# Patient Record
Sex: Female | Born: 1980 | Hispanic: Yes | Marital: Single | Smoking: Never smoker
Health system: Southern US, Community
[De-identification: ages and names within clinical notes are randomized; demographics above are authoritative.]

## PROBLEM LIST (undated history)

## (undated) ENCOUNTER — Inpatient Hospital Stay (HOSPITAL_COMMUNITY): Payer: Self-pay

## (undated) DIAGNOSIS — F329 Major depressive disorder, single episode, unspecified: Secondary | ICD-10-CM

## (undated) DIAGNOSIS — F32A Depression, unspecified: Secondary | ICD-10-CM

## (undated) DIAGNOSIS — E785 Hyperlipidemia, unspecified: Secondary | ICD-10-CM

## (undated) DIAGNOSIS — N390 Urinary tract infection, site not specified: Secondary | ICD-10-CM

## (undated) DIAGNOSIS — J45909 Unspecified asthma, uncomplicated: Secondary | ICD-10-CM

## (undated) DIAGNOSIS — D649 Anemia, unspecified: Secondary | ICD-10-CM

## (undated) HISTORY — DX: Depression, unspecified: F32.A

## (undated) HISTORY — DX: Major depressive disorder, single episode, unspecified: F32.9

## (undated) HISTORY — DX: Urinary tract infection, site not specified: N39.0

## (undated) HISTORY — DX: Anemia, unspecified: D64.9

## (undated) HISTORY — DX: Hyperlipidemia, unspecified: E78.5

## (undated) HISTORY — DX: Unspecified asthma, uncomplicated: J45.909

---

## 2004-07-05 ENCOUNTER — Emergency Department (HOSPITAL_COMMUNITY): Admission: EM | Admit: 2004-07-05 | Discharge: 2004-07-05 | Payer: Self-pay | Admitting: *Deleted

## 2004-07-22 ENCOUNTER — Emergency Department (HOSPITAL_COMMUNITY): Admission: EM | Admit: 2004-07-22 | Discharge: 2004-07-22 | Payer: Self-pay | Admitting: Family Medicine

## 2008-06-06 ENCOUNTER — Emergency Department (HOSPITAL_COMMUNITY): Admission: EM | Admit: 2008-06-06 | Discharge: 2008-06-06 | Payer: Self-pay | Admitting: Emergency Medicine

## 2012-06-17 ENCOUNTER — Encounter: Payer: Self-pay | Admitting: Obstetrics and Gynecology

## 2012-09-09 ENCOUNTER — Other Ambulatory Visit (HOSPITAL_COMMUNITY): Payer: Self-pay | Admitting: Physician Assistant

## 2012-09-09 DIAGNOSIS — Z3689 Encounter for other specified antenatal screening: Secondary | ICD-10-CM

## 2012-09-09 LAB — OB RESULTS CONSOLE HEPATITIS B SURFACE ANTIGEN: Hepatitis B Surface Ag: NEGATIVE

## 2012-09-09 LAB — OB RESULTS CONSOLE ABO/RH: RH Type: POSITIVE

## 2012-09-09 LAB — OB RESULTS CONSOLE ANTIBODY SCREEN: Antibody Screen: NEGATIVE

## 2012-09-09 LAB — OB RESULTS CONSOLE GC/CHLAMYDIA: Gonorrhea: NEGATIVE

## 2012-09-09 LAB — OB RESULTS CONSOLE RPR: RPR: NONREACTIVE

## 2012-10-28 ENCOUNTER — Encounter (HOSPITAL_COMMUNITY): Payer: Self-pay

## 2012-10-28 ENCOUNTER — Ambulatory Visit (HOSPITAL_COMMUNITY)
Admission: RE | Admit: 2012-10-28 | Discharge: 2012-10-28 | Disposition: A | Discharge status: Home / Self Care | Payer: Self-pay | Source: Ambulatory Visit | Attending: Physician Assistant | Admitting: Physician Assistant

## 2012-10-28 DIAGNOSIS — Z1389 Encounter for screening for other disorder: Secondary | ICD-10-CM | POA: Insufficient documentation

## 2012-10-28 DIAGNOSIS — Z3689 Encounter for other specified antenatal screening: Secondary | ICD-10-CM

## 2012-10-28 DIAGNOSIS — Z363 Encounter for antenatal screening for malformations: Secondary | ICD-10-CM | POA: Insufficient documentation

## 2012-10-28 DIAGNOSIS — O358XX0 Maternal care for other (suspected) fetal abnormality and damage, not applicable or unspecified: Secondary | ICD-10-CM | POA: Insufficient documentation

## 2013-03-03 ENCOUNTER — Other Ambulatory Visit: Payer: Self-pay | Admitting: Nurse Practitioner

## 2013-03-03 DIAGNOSIS — N644 Mastodynia: Secondary | ICD-10-CM

## 2013-03-03 DIAGNOSIS — N63 Unspecified lump in unspecified breast: Secondary | ICD-10-CM

## 2013-03-03 LAB — OB RESULTS CONSOLE GBS: GBS: NEGATIVE

## 2013-03-08 ENCOUNTER — Ambulatory Visit
Admission: RE | Admit: 2013-03-08 | Discharge: 2013-03-08 | Disposition: A | Discharge status: Home / Self Care | Payer: No Typology Code available for payment source | Source: Ambulatory Visit | Attending: Nurse Practitioner | Admitting: Nurse Practitioner

## 2013-03-08 DIAGNOSIS — N63 Unspecified lump in unspecified breast: Secondary | ICD-10-CM

## 2013-03-08 DIAGNOSIS — N644 Mastodynia: Secondary | ICD-10-CM

## 2013-03-30 ENCOUNTER — Encounter (HOSPITAL_COMMUNITY): Payer: Self-pay | Admitting: *Deleted

## 2013-03-30 ENCOUNTER — Other Ambulatory Visit (HOSPITAL_COMMUNITY): Payer: Self-pay | Admitting: Family

## 2013-03-30 ENCOUNTER — Telehealth (HOSPITAL_COMMUNITY): Payer: Self-pay | Admitting: *Deleted

## 2013-03-30 DIAGNOSIS — O480 Post-term pregnancy: Secondary | ICD-10-CM

## 2013-03-30 NOTE — Telephone Encounter (Signed)
Preadmission screen [phone] interpreter number pt states she is feeling poorly with difficulty sleeping and pain at her waist.  Told pt she can come to [facility] for evaluation.

## 2013-04-01 ENCOUNTER — Ambulatory Visit (HOSPITAL_COMMUNITY): Admission: RE | Admit: 2013-04-01 | Payer: Self-pay | Source: Ambulatory Visit

## 2013-04-01 ENCOUNTER — Ambulatory Visit (HOSPITAL_COMMUNITY)
Admission: RE | Admit: 2013-04-01 | Discharge: 2013-04-01 | Disposition: A | Discharge status: Home / Self Care | Payer: Self-pay | Source: Ambulatory Visit | Attending: Family | Admitting: Family

## 2013-04-01 DIAGNOSIS — Z3689 Encounter for other specified antenatal screening: Secondary | ICD-10-CM | POA: Insufficient documentation

## 2013-04-01 DIAGNOSIS — O480 Post-term pregnancy: Secondary | ICD-10-CM | POA: Insufficient documentation

## 2013-04-05 ENCOUNTER — Inpatient Hospital Stay (HOSPITAL_COMMUNITY)
Admission: RE | Admit: 2013-04-05 | Discharge: 2013-04-11 | DRG: 765 | Disposition: A | Discharge status: Home / Self Care | Payer: Medicaid Other | Source: Ambulatory Visit | Attending: Family Medicine | Admitting: Family Medicine

## 2013-04-05 ENCOUNTER — Encounter (HOSPITAL_COMMUNITY): Payer: Self-pay

## 2013-04-05 DIAGNOSIS — O411090 Infection of amniotic sac and membranes, unspecified, unspecified trimester, not applicable or unspecified: Secondary | ICD-10-CM | POA: Diagnosis present

## 2013-04-05 DIAGNOSIS — O480 Post-term pregnancy: Principal | ICD-10-CM | POA: Diagnosis present

## 2013-04-05 LAB — CBC
Hemoglobin: 11.3 g/dL — ABNORMAL LOW (ref 12.0–15.0)
MCH: 28.6 pg (ref 26.0–34.0)
MCHC: 33.1 g/dL (ref 30.0–36.0)
MCV: 86.3 fL (ref 78.0–100.0)
Platelets: 195 10*3/uL (ref 150–400)
RBC: 3.95 MIL/uL (ref 3.87–5.11)

## 2013-04-05 MED ORDER — ACETAMINOPHEN 325 MG PO TABS: 650.0000 mg | ORAL_TABLET | ORAL | Status: DC | PRN

## 2013-04-05 MED ORDER — OXYTOCIN 40 UNITS IN LACTATED RINGERS INFUSION - SIMPLE MED: 62.5000 mL/h | INTRAVENOUS | Status: DC

## 2013-04-05 MED ORDER — CITRIC ACID-SODIUM CITRATE 334-500 MG/5ML PO SOLN
30.0000 mL | ORAL | Status: DC | PRN
  Administered 2013-04-08: 30 mL via ORAL
  Filled 2013-04-05: qty 15

## 2013-04-05 MED ORDER — OXYTOCIN BOLUS FROM INFUSION: 500.0000 mL | INTRAVENOUS | Status: DC

## 2013-04-05 MED ORDER — LACTATED RINGERS IV SOLN
INTRAVENOUS | Status: DC
  Administered 2013-04-05 – 2013-04-08 (×8): via INTRAVENOUS

## 2013-04-05 MED ORDER — IBUPROFEN 600 MG PO TABS: 600.0000 mg | ORAL_TABLET | Freq: Four times a day (QID) | ORAL | Status: DC | PRN

## 2013-04-05 MED ORDER — ONDANSETRON HCL 4 MG/2ML IJ SOLN
4.0000 mg | Freq: Four times a day (QID) | INTRAMUSCULAR | Status: DC | PRN
  Administered 2013-04-07: 4 mg via INTRAVENOUS
  Filled 2013-04-05: qty 2

## 2013-04-05 MED ORDER — TERBUTALINE SULFATE 1 MG/ML IJ SOLN: 0.2500 mg | Freq: Once | INTRAMUSCULAR | Status: AC | PRN

## 2013-04-05 MED ORDER — LIDOCAINE HCL (PF) 1 % IJ SOLN
30.0000 mL | INTRAMUSCULAR | Status: DC | PRN
  Filled 2013-04-05: qty 30

## 2013-04-05 MED ORDER — OXYCODONE-ACETAMINOPHEN 5-325 MG PO TABS: 1.0000 | ORAL_TABLET | ORAL | Status: DC | PRN

## 2013-04-05 MED ORDER — LACTATED RINGERS IV SOLN: 500.0000 mL | INTRAVENOUS | Status: DC | PRN

## 2013-04-05 MED ORDER — MISOPROSTOL 25 MCG QUARTER TABLET
25.0000 ug | ORAL_TABLET | ORAL | Status: DC | PRN
  Administered 2013-04-05 – 2013-04-06 (×5): 25 ug via VAGINAL
  Filled 2013-04-05 (×5): qty 0.25

## 2013-04-05 NOTE — H&P (Signed)
Emily Andrade is a 32 y.o. female G1P0 at [redacted]w[redacted]d (based on LMP and 18w U/S) presenting for IOL for post-dates.   She has no complaints at this time other than some lower abdominal cramping with intermittent contractions. She denies HA, vision changes, RUQ/epigastric pain, swelling. No VB, LOF, regular contractions. Feeling good fetal movement.   [facility] has provided [facility]. This is her first pregnancy and is notable for equivocal rubella immune status. She also has a history of depression.   Interview was facilitated with Spanish interpreter.  History OB History   Grav Para Term Preterm Abortions TAB SAB Ect Mult Living   1              Past Medical History  Diagnosis Date  . Frequent UTI   . Depression   . Anemia   . Asthma     childhood   Past Surgical History  Procedure Laterality Date  . No past surgeries     Family History: family history includes Birth defects in her other; Diabetes in her father; Heart disease in her father. Social History:  reports that she has never smoked. She has never used smokeless tobacco. She reports that she does not drink alcohol or use illicit drugs.   Prenatal Transfer Tool  Maternal Diabetes: No Genetic Screening: Normal Maternal Ultrasounds/Referrals: Normal Fetal Ultrasounds or other Referrals:  None Maternal Substance Abuse:  No Significant Maternal Medications:  None Significant Maternal Lab Results:  Lab values include: Group B Strep negative, Other: Rubella equivocal immune status.  Other Comments:  None  ROS As per HPI, otherwise ROS negative.    Blood pressure 120/88, pulse 66, temperature 98.5 F (36.9 C), temperature source Oral, resp. rate 18, height 5\' 1"  (1.549 m), weight 72.576 kg (160 lb), last menstrual period 06/18/2012. Maternal Exam:  Introitus: Vulva is negative for lesion.  Vagina is negative for discharge.    Physical Exam  Constitutional: She is oriented to person, place, and time. She appears  well-developed and well-nourished.  HENT:  Head: Normocephalic.  Mouth/Throat: Oropharynx is clear and moist.  Eyes: EOM are normal. Pupils are equal, round, and reactive to light.  Neck: Normal range of motion. Neck supple.  Cardiovascular: Normal rate, regular rhythm and normal heart sounds.   Respiratory: Effort normal and breath sounds normal. No respiratory distress.  GI: Soft. There is no tenderness.  Genitourinary: Vulva exhibits no lesion. No vaginal discharge found.  Musculoskeletal: Normal range of motion.  Neurological: She is alert and oriented to person, place, and time.  Skin: Skin is warm.    Cervical exam: FT/thick/mid/-2  FHT: Baseline 130, mod variability, 15x15 accels present, no decels Toco: Irregular contractions  Prenatal labs: ABO, Rh: O/Positive/-- (05/08 0000) Antibody: Negative (05/08 0000) Rubella: Equivocal (05/08 0000) RPR: Nonreactive (05/08 0000)  HBsAg: Negative (05/08 0000)  HIV: Non-reactive (05/08 0000)  GBS: Negative (10/30 0000)   Assessment/Plan: Emily Andrade is a 32 y.o. G1P0 at [redacted]w[redacted]d here for IOL for postdates.   #Labor: Cytotec for cervical ripening (Bishop score 2) #Pain:  IV fentanyl, epidural upon request.  #FWB: Category I  #ID:  GBS neg #MOF: Breast feeding #MOC: Undecided #Circ:  Declines  Anticipate NSVD  Will need MMR post partum  [name] 04/05/2013, 8:52 PM  I was present for the exam and agree with above.  [place], CNM 04/06/2013 2:27 AM

## 2013-04-06 LAB — RPR: RPR Ser Ql: NONREACTIVE

## 2013-04-06 LAB — ABO/RH: ABO/RH(D): O POS

## 2013-04-06 MED ORDER — BUTORPHANOL TARTRATE 1 MG/ML IJ SOLN
2.0000 mg | INTRAMUSCULAR | Status: DC | PRN
  Administered 2013-04-07 (×2): 2 mg via INTRAVENOUS
  Filled 2013-04-06 (×3): qty 2

## 2013-04-06 MED ORDER — OXYTOCIN 40 UNITS IN LACTATED RINGERS INFUSION - SIMPLE MED
1.0000 m[IU]/min | INTRAVENOUS | Status: DC
  Administered 2013-04-06: 6 m[IU]/min via INTRAVENOUS

## 2013-04-06 MED ORDER — OXYTOCIN 40 UNITS IN LACTATED RINGERS INFUSION - SIMPLE MED
1.0000 m[IU]/min | INTRAVENOUS | Status: DC
  Administered 2013-04-06: 2 m[IU]/min via INTRAVENOUS
  Filled 2013-04-06: qty 1000

## 2013-04-06 MED ORDER — TERBUTALINE SULFATE 1 MG/ML IJ SOLN: 0.2500 mg | Freq: Once | INTRAMUSCULAR | Status: AC | PRN

## 2013-04-06 NOTE — Progress Notes (Signed)
Emily Andrade is a 32 y.o. G1P0 at [redacted]w[redacted]d.  Subjective: Cramping   Objective: BP 109/62  Pulse 53  Temp(Src) 98.2 F (36.8 C) (Oral)  Resp 18  Ht 5\' 1"  (1.549 m)  Wt 72.576 kg (160 lb)  BMI 30.25 kg/m2  LMP 06/18/2012      FHT:  FHR: 140 bpm, variability: min-moderate,  accelerations:  Present,  decelerations:  Absent UC:   regular, every 2-4 minutes, mild SVE:   Dilation: Fingertip Effacement (%): 40 Station: -2 Exam by:: Emily Andrade cnm  Labs: Lab Results  Component Value Date   WBC 6.6 04/05/2013   HGB 11.3* 04/05/2013   HCT 34.1* 04/05/2013   MCV 86.3 04/05/2013   PLT 195 04/05/2013    Assessment / Plan: Induction of labor due to postterm,  progressing appropriately  Labor: Progressing normally Preeclampsia:  NA Fetal Wellbeing:  Category I Pain Control:  Labor support without medications I/D:  n/a Anticipated MOD:  NSVD  Emily Andrade States 04/06/2013, 6:44 AM

## 2013-04-06 NOTE — Progress Notes (Signed)
Attestation of Attending Supervision of Advanced Practitioner: Evaluation and management procedures were performed by the PA/NP/CNM/OB Fellow under my supervision/collaboration. Chart reviewed and agree with management and plan.  I was able to assist with evaluation, and  Place foley in cervix with 60 cc saline instilled. Tilda Burrow 04/06/2013 9:39 PM

## 2013-04-06 NOTE — Progress Notes (Signed)
Emily Andrade is a 32 y.o. G1P0 at [redacted]w[redacted]d admitted for IOL for post dates.   Subjective: Pt reporting lower abdominal cramping with contractions, not severe enough for pain medication. Otherwise doing well.   Objective: BP 108/71  Pulse 53  Temp(Src) 98.5 F (36.9 C) (Oral)  Resp 18  Ht 5\' 1"  (1.549 m)  Wt 72.576 kg (160 lb)  BMI 30.25 kg/m2  LMP 06/18/2012      FHT:  FHR: 140 bpm, variability: moderate,  accelerations:  Present,  decelerations:  Absent UC:   irregular, every 2-5 minutes SVE:   Dilation: 1.5 Effacement (%): Thick Station: -2 Exam by:: [employer]: Lab Results  Component Value Date   WBC 6.6 04/05/2013   HGB 11.3* 04/05/2013   HCT 34.1* 04/05/2013   MCV 86.3 04/05/2013   PLT 195 04/05/2013    Assessment / Plan: IOL for post dates, progressing with foley bulb still in place.   Labor: FB in place, continuing on pit up to 6 mu/min.  Preeclampsia:  No signs or symptoms.  Fetal Wellbeing:  Category I Pain Control:  Labor support without medications I/D:  GBS neg Anticipated MOD:  NSVD  Emily Andrade 04/06/2013, 11:59 PM

## 2013-04-06 NOTE — Progress Notes (Signed)
Emily Andrade is a 32 y.o. G1P0 at [redacted]w[redacted]d admitted for induction of labor due to Post dates.  Subjective: Pt comfortable without issue. Minimal pressrue  Objective: BP 115/52  Pulse 59  Temp(Src) 97.9 F (36.6 C) (Oral)  Resp 18  Ht 5\' 1"  (1.549 m)  Wt 72.576 kg (160 lb)  BMI 30.25 kg/m2  LMP 06/18/2012      FHT:  FHR: 150 bpm, variability: minimal ,  accelerations:  Abscent,  decelerations:  Absent UC:   irregular, every 3-6 minutes SVE:   Dilation: Fingertip Effacement (%): 50 Station: -1 Exam by:: J.Cox, RN  Labs: Lab Results  Component Value Date   WBC 6.6 04/05/2013   HGB 11.3* 04/05/2013   HCT 34.1* 04/05/2013   MCV 86.3 04/05/2013   PLT 195 04/05/2013    Assessment / Plan: Induction of labor due to postterm,  progressing well on pitocin  Labor: No significant change, no w/ 4th dose of cytotec Preeclampsia:  no signs or symptoms of toxicity Fetal Wellbeing:  Category II Pain Control:  Labor support without medications at this time I/D:  n/a Anticipated MOD:  NSVD  Emily Andrade 04/06/2013, 12:03 PM

## 2013-04-06 NOTE — Progress Notes (Signed)
I was present for the exam and agree with above.  [place], CNM 04/06/2013 2:27 AM

## 2013-04-06 NOTE — Progress Notes (Signed)
Emily Andrade is a 32 y.o. G1P0 at [redacted]w[redacted]d admitted for induction of labor due to Post dates. Due date 03/25/13.  Subjective: Pt reports more pain with contractions but they are still infrequent and only minimally painful. She has no complaints. With translator present, foley bulb placement was discussed and she agreed to have it placed.  Objective: BP 113/58  Pulse 57  Temp(Src) 98.3 F (36.8 C) (Oral)  Resp 18  Ht 5\' 1"  (1.549 m)  Wt 72.576 kg (160 lb)  BMI 30.25 kg/m2  LMP 06/18/2012      FHT:  FHR: 145 bpm, variability: moderate,  accelerations:  Present,  decelerations:  Absent UC:   Irregular with no pattern SVE:   Dilation: 1.5 Effacement (%): Thick Station: -2 Exam by:: [employer]: Lab Results  Component Value Date   WBC 6.6 04/05/2013   HGB 11.3* 04/05/2013   HCT 34.1* 04/05/2013   MCV 86.3 04/05/2013   PLT 195 04/05/2013    Assessment / Plan: Induction of labor due to postterm, now making cervical change with cytotec.  Labor: Cervical change made with cytotec but not favorable for pitocin at this time. Foley bulb placed by Dr. Emelda Fear. Will start pit at 2000. Preeclampsia:  no signs/symptoms Fetal Wellbeing:  Category I Pain Control:  Labor support without medications I/D:  n/a GBS neg Anticipated MOD:  NSVD  Emily Andrade, Emily Andrade 04/06/2013, 6:27 PM

## 2013-04-06 NOTE — Progress Notes (Signed)
Emily Andrade is a 32 y.o. G1P0 at [redacted]w[redacted]d by LMP admitted for induction of labor due to post-dates.  Subjective: Pt feeling crampy discomfort in lower abdomen, declines pain medications at this time. + GFM  Objective: BP 95/69  Pulse 82  Temp(Src) 98.2 F (36.8 C) (Oral)  Resp 18  Ht 5\' 1"  (1.549 m)  Wt 72.576 kg (160 lb)  BMI 30.25 kg/m2  LMP 06/18/2012      FHT:  FHR: 130 bpm, variability: moderate,  accelerations:  Present,  decelerations:  Absent UC:   irregular, every 8-10 minutes SVE:   Dilation: Fingertip Effacement (%): Thick Station: -2 Exam by:: v. smith cnm  Labs: Lab Results  Component Value Date   WBC 6.6 04/05/2013   HGB 11.3* 04/05/2013   HCT 34.1* 04/05/2013   MCV 86.3 04/05/2013   PLT 195 04/05/2013    Assessment / Plan: Emily Andrade is a 32 y.o. G1P0 at [redacted]w[redacted]d for IOL. Continuing cytotec. To place foley bulb when able.    Labor: Continue cervical ripening and begin pitocin when cervix favorable. Preeclampsia:  n/a Fetal Wellbeing:  Category I Pain Control:  Fentanyl I/D:  n/a Anticipated MOD:  NSVD  Emily Andrade 04/06/2013, 2:05 AM

## 2013-04-07 ENCOUNTER — Inpatient Hospital Stay (HOSPITAL_COMMUNITY): Payer: Medicaid Other | Admitting: Anesthesiology

## 2013-04-07 ENCOUNTER — Encounter (HOSPITAL_COMMUNITY): Payer: Medicaid Other | Admitting: Anesthesiology

## 2013-04-07 ENCOUNTER — Encounter (HOSPITAL_COMMUNITY): Payer: Self-pay

## 2013-04-07 MED ORDER — PHENYLEPHRINE 40 MCG/ML (10ML) SYRINGE FOR IV PUSH (FOR BLOOD PRESSURE SUPPORT): 80.0000 ug | PREFILLED_SYRINGE | INTRAVENOUS | Status: DC | PRN

## 2013-04-07 MED ORDER — OXYTOCIN 40 UNITS IN LACTATED RINGERS INFUSION - SIMPLE MED
1.0000 m[IU]/min | INTRAVENOUS | Status: DC
  Administered 2013-04-07: 10 m[IU]/min via INTRAVENOUS
  Administered 2013-04-08 (×2): 40 m[IU]/min via INTRAVENOUS
  Filled 2013-04-07 (×2): qty 1000

## 2013-04-07 MED ORDER — EPHEDRINE 5 MG/ML INJ
10.0000 mg | INTRAVENOUS | Status: DC | PRN
  Filled 2013-04-07: qty 4

## 2013-04-07 MED ORDER — EPHEDRINE 5 MG/ML INJ: 10.0000 mg | INTRAVENOUS | Status: DC | PRN

## 2013-04-07 MED ORDER — FENTANYL 2.5 MCG/ML BUPIVACAINE 1/10 % EPIDURAL INFUSION (WH - ANES)
14.0000 mL/h | INTRAMUSCULAR | Status: DC | PRN
  Administered 2013-04-07 – 2013-04-08 (×3): 14 mL/h via EPIDURAL
  Filled 2013-04-07 (×4): qty 125

## 2013-04-07 MED ORDER — LIDOCAINE HCL (PF) 1 % IJ SOLN
INTRAMUSCULAR | Status: DC | PRN
  Administered 2013-04-07 (×2): 9 mL

## 2013-04-07 MED ORDER — DIPHENHYDRAMINE HCL 50 MG/ML IJ SOLN: 12.5000 mg | INTRAMUSCULAR | Status: DC | PRN

## 2013-04-07 MED ORDER — LACTATED RINGERS IV SOLN
500.0000 mL | Freq: Once | INTRAVENOUS | Status: AC
  Administered 2013-04-07: 1000 mL via INTRAVENOUS

## 2013-04-07 MED ORDER — PHENYLEPHRINE 40 MCG/ML (10ML) SYRINGE FOR IV PUSH (FOR BLOOD PRESSURE SUPPORT)
80.0000 ug | PREFILLED_SYRINGE | INTRAVENOUS | Status: DC | PRN
  Filled 2013-04-07: qty 10

## 2013-04-07 MED ORDER — FENTANYL 2.5 MCG/ML BUPIVACAINE 1/10 % EPIDURAL INFUSION (WH - ANES)
INTRAMUSCULAR | Status: DC | PRN
  Administered 2013-04-07: 14 mL/h via EPIDURAL

## 2013-04-07 NOTE — Anesthesia Procedure Notes (Signed)
Epidural Patient location during procedure: OB Start time: 04/07/2013 2:08 PM End time: 04/07/2013 2:12 PM  Staffing Anesthesiologist: Leilani Able Performed by: anesthesiologist   Preanesthetic Checklist Completed: patient identified, surgical consent, pre-op evaluation, timeout performed, IV checked, risks and benefits discussed and monitors and equipment checked  Epidural Patient position: sitting Prep: site prepped and draped and DuraPrep Patient monitoring: continuous pulse ox and blood pressure Approach: midline Injection technique: LOR air  Needle:  Needle type: Tuohy  Needle gauge: 17 G Needle length: 9 cm and 9 Needle insertion depth: 5 cm cm Catheter type: closed end flexible Catheter size: 19 Gauge Catheter at skin depth: 10 cm Test dose: negative and Other  Assessment Sensory level: T9 Events: blood not aspirated, injection not painful, no injection resistance, negative IV test and no paresthesia  Additional Notes Reason for block:procedure for pain

## 2013-04-07 NOTE — Progress Notes (Signed)
Emily Andrade is a 32 y.o. G1P0 at [redacted]w[redacted]d by LMP admitted for induction of labor due to Post dates. Due date 03/25/13.  Subjective: pt still tolerating mild-mod contractions with IV analgesics only   Objective: BP 98/58  Pulse 66  Temp(Src) 98 F (36.7 C) (Oral)  Resp 20  Ht 5\' 1"  (1.549 m)  Wt 72.576 kg (160 lb)  BMI 30.25 kg/m2  LMP 06/18/2012      FHT:  FHR: 145 bpm, variability: moderate,  accelerations:  Present,  decelerations:  Absent UC:   regular, every 3 minutes SVE:   Dilation: 4 Effacement (%): 20;30 Station: -2 Exam by:: dr. Emelda Fear  Labs: Lab Results  Component Value Date   WBC 6.6 04/05/2013   HGB 11.3* 04/05/2013   HCT 34.1* 04/05/2013   MCV 86.3 04/05/2013   PLT 195 04/05/2013    Assessment / Plan: Induction of labor due to postterm,  progressing well on pitocin  Labor: slow progress, AROM performed, will monitor for arrest of labor Preeclampsia:  3 Fetal Wellbeing:  Category I Pain Control:  Labor support without medications and iv stadol I/D:  n/a Anticipated MOD:  undetermined  Emily Andrade V 04/07/2013, 5:21 AM

## 2013-04-07 NOTE — Anesthesia Preprocedure Evaluation (Signed)
Anesthesia Evaluation  Patient identified by MRN, date of birth, ID band Patient awake    Reviewed: Allergy & Precautions, H&P , NPO status , Patient's Chart, lab work & pertinent test results  Airway Mallampati: II TM Distance: >3 FB Neck ROM: full    Dental no notable dental hx.    Pulmonary    Pulmonary exam normal       Cardiovascular negative cardio ROS      Neuro/Psych negative neurological ROS     GI/Hepatic negative GI ROS, Neg liver ROS,   Endo/Other  negative endocrine ROS  Renal/GU negative Renal ROS  negative genitourinary   Musculoskeletal negative musculoskeletal ROS (+)   Abdominal Normal abdominal exam  (+)   Peds  Hematology   Anesthesia Other Findings   Reproductive/Obstetrics (+) Pregnancy                           Anesthesia Physical Anesthesia Plan  ASA: II  Anesthesia Plan: Epidural   Post-op Pain Management:    Induction:   Airway Management Planned:   Additional Equipment:   Intra-op Plan:   Post-operative Plan:   Informed Consent: I have reviewed the patients History and Physical, chart, labs and discussed the procedure including the risks, benefits and alternatives for the proposed anesthesia with the patient or authorized representative who has indicated his/her understanding and acceptance.     Plan Discussed with:   Anesthesia Plan Comments:         Anesthesia Quick Evaluation

## 2013-04-07 NOTE — Progress Notes (Signed)
Emily Andrade is a 32 y.o. G1P0000 at [redacted]w[redacted]d by LMP admitted for induction of labor due to Post dates. Due date 03/25/13.  Subjective: Feeling more pain with contractions now and only getting little relief with IV pain meds.   Objective: BP 121/76  Pulse 62  Temp(Src) 98.3 F (36.8 C) (Oral)  Resp 20  Ht 5\' 1"  (1.549 m)  Wt 72.576 kg (160 lb)  BMI 30.25 kg/m2  LMP 06/18/2012      FHT:  FHR: 115 bpm, variability: moderate,  accelerations:  Present,  decelerations:  Absent UC:   regular, every 3-4 minutes SVE: Dilation: 5 Effacement (%): 80 Cervical Position: Anterior Station: -1 Presentation: Vertex Exam by:: Enis Slipper, RN Mod Mec  Labs: Lab Results  Component Value Date   WBC 6.6 04/05/2013   HGB 11.3* 04/05/2013   HCT 34.1* 04/05/2013   MCV 86.3 04/05/2013   PLT 195 04/05/2013    Assessment / Plan: Induction of labor due to postterm,  progressing well on pitocin  Labor: Progressing normally Fetal Wellbeing:  Category I Pain Control:  Fentanyl I/D:  GBS neg Anticipated MOD:  NSVD  Pior, Jearld Lesch 04/07/2013, 12:23 PM  I have seen and examined this patient and agree with above documentation in the resident's note. Pt now uncomfortable and requesting epidural.  Will get and then recheck  Rulon Abide, M.D. [facility] Fellow 04/07/2013 1:52 PM

## 2013-04-07 NOTE — H&P (Signed)
Attestation of Attending Supervision of Advanced Practitioner: Evaluation and management procedures were performed by the PA/NP/CNM/OB Fellow under my supervision/collaboration. Chart reviewed and agree with management and plan.  Xandra Laramee V 04/07/2013 5:43 AM

## 2013-04-07 NOTE — Progress Notes (Addendum)
Emily Andrade is a 32 y.o. G1P0 at [redacted]w[redacted]d  admitted for induction of labor due to postdates.  Subjective:  Pt doing well. Feeling contractions.  Taking iv fentanyl. +FM. Continued LOF.   Objective: BP 124/71  Pulse 53  Temp(Src) 97.9 F (36.6 C) (Oral)  Resp 20  Ht 5\' 1"  (1.549 m)  Wt 160 lb (72.576 kg)  BMI 30.25 kg/m2  LMP 06/18/2012      FHT:  FHR: 140 bpm, variability: moderate,  accelerations:  Present,  decelerations:  Absent UC:   Regular 4-5 min  SVE:   Dilation: 4.5 Effacement (%): 50 Station: -2 Exam by:: Dr. Reola Calkins  Labs: Lab Results  Component Value Date   WBC 6.6 04/05/2013   HGB 11.3* 04/05/2013   HCT 34.1* 04/05/2013   MCV 86.3 04/05/2013   PLT 195 04/05/2013    Assessment / Plan: Induction of labor due to postterm,  progressing well on pitocin  Labor: Progressing normally on 12 mu/min of pit. Cont to increase.  Fetal Wellbeing:  Category I Pain Control:  Fentanyl I/D:  n/a Anticipated MOD:  NSVD  Zooey Schreurs L 04/07/2013, 9:02 AM

## 2013-04-08 ENCOUNTER — Encounter (HOSPITAL_COMMUNITY)
Admission: RE | Disposition: A | Discharge status: Home / Self Care | Payer: Self-pay | Source: Ambulatory Visit | Attending: Family Medicine

## 2013-04-08 ENCOUNTER — Encounter (HOSPITAL_COMMUNITY): Payer: Self-pay

## 2013-04-08 LAB — PREPARE RBC (CROSSMATCH)

## 2013-04-08 SURGERY — Surgical Case
Anesthesia: Epidural | Site: Abdomen

## 2013-04-08 MED ORDER — OXYCODONE-ACETAMINOPHEN 5-325 MG PO TABS
1.0000 | ORAL_TABLET | ORAL | Status: DC | PRN
  Administered 2013-04-10: 2 via ORAL
  Administered 2013-04-10 – 2013-04-11 (×2): 1 via ORAL
  Filled 2013-04-08: qty 2
  Filled 2013-04-08 (×2): qty 1

## 2013-04-08 MED ORDER — DIPHENHYDRAMINE HCL 50 MG/ML IJ SOLN: 12.5000 mg | INTRAMUSCULAR | Status: DC | PRN

## 2013-04-08 MED ORDER — ONDANSETRON HCL 4 MG/2ML IJ SOLN: 4.0000 mg | Freq: Three times a day (TID) | INTRAMUSCULAR | Status: DC | PRN

## 2013-04-08 MED ORDER — DIPHENHYDRAMINE HCL 25 MG PO CAPS: 25.0000 mg | ORAL_CAPSULE | Freq: Four times a day (QID) | ORAL | Status: DC | PRN

## 2013-04-08 MED ORDER — OXYTOCIN 10 UNIT/ML IJ SOLN
40.0000 [IU] | INTRAVENOUS | Status: DC | PRN
  Administered 2013-04-08: 40 [IU] via INTRAVENOUS

## 2013-04-08 MED ORDER — CEFAZOLIN SODIUM-DEXTROSE 2-3 GM-% IV SOLR
INTRAVENOUS | Status: DC | PRN
  Administered 2013-04-08: 2 g via INTRAVENOUS

## 2013-04-08 MED ORDER — OXYTOCIN 10 UNIT/ML IJ SOLN
INTRAMUSCULAR | Status: AC
  Filled 2013-04-08: qty 4

## 2013-04-08 MED ORDER — ONDANSETRON HCL 4 MG/2ML IJ SOLN
INTRAMUSCULAR | Status: DC | PRN
  Administered 2013-04-08: 4 mg via INTRAVENOUS

## 2013-04-08 MED ORDER — SCOPOLAMINE 1 MG/3DAYS TD PT72
1.0000 | MEDICATED_PATCH | Freq: Once | TRANSDERMAL | Status: DC
  Administered 2013-04-08: 1.5 mg via TRANSDERMAL

## 2013-04-08 MED ORDER — DEXTROSE 5 % IV SOLN
120.0000 mg | Freq: Three times a day (TID) | INTRAVENOUS | Status: DC
  Administered 2013-04-08: 120 mg via INTRAVENOUS
  Filled 2013-04-08 (×2): qty 3

## 2013-04-08 MED ORDER — KETOROLAC TROMETHAMINE 30 MG/ML IJ SOLN
INTRAMUSCULAR | Status: AC
  Filled 2013-04-08: qty 1

## 2013-04-08 MED ORDER — KETOROLAC TROMETHAMINE 30 MG/ML IJ SOLN
30.0000 mg | Freq: Four times a day (QID) | INTRAMUSCULAR | Status: AC | PRN
  Administered 2013-04-08: 30 mg via INTRAVENOUS

## 2013-04-08 MED ORDER — DIBUCAINE 1 % RE OINT: 1.0000 "application " | TOPICAL_OINTMENT | RECTAL | Status: DC | PRN

## 2013-04-08 MED ORDER — IBUPROFEN 600 MG PO TABS: 600.0000 mg | ORAL_TABLET | Freq: Four times a day (QID) | ORAL | Status: DC | PRN

## 2013-04-08 MED ORDER — WITCH HAZEL-GLYCERIN EX PADS: 1.0000 "application " | MEDICATED_PAD | CUTANEOUS | Status: DC | PRN

## 2013-04-08 MED ORDER — ONDANSETRON HCL 4 MG/2ML IJ SOLN
INTRAMUSCULAR | Status: AC
  Filled 2013-04-08: qty 2

## 2013-04-08 MED ORDER — OXYTOCIN 40 UNITS IN LACTATED RINGERS INFUSION - SIMPLE MED: 62.5000 mL/h | INTRAVENOUS | Status: AC

## 2013-04-08 MED ORDER — METOCLOPRAMIDE HCL 5 MG/ML IJ SOLN: 10.0000 mg | Freq: Three times a day (TID) | INTRAMUSCULAR | Status: DC | PRN

## 2013-04-08 MED ORDER — PRENATAL MULTIVITAMIN CH
1.0000 | ORAL_TABLET | Freq: Every day | ORAL | Status: DC
  Administered 2013-04-09 – 2013-04-10 (×2): 1 via ORAL
  Filled 2013-04-08 (×2): qty 1

## 2013-04-08 MED ORDER — BUPIVACAINE HCL (PF) 0.25 % IJ SOLN
INTRAMUSCULAR | Status: DC | PRN
  Administered 2013-04-08: 30 mL

## 2013-04-08 MED ORDER — BUPIVACAINE HCL (PF) 0.25 % IJ SOLN
INTRAMUSCULAR | Status: AC
  Filled 2013-04-08: qty 30

## 2013-04-08 MED ORDER — NALBUPHINE HCL 10 MG/ML IJ SOLN: 5.0000 mg | INTRAMUSCULAR | Status: DC | PRN

## 2013-04-08 MED ORDER — PHENYLEPHRINE 40 MCG/ML (10ML) SYRINGE FOR IV PUSH (FOR BLOOD PRESSURE SUPPORT)
PREFILLED_SYRINGE | INTRAVENOUS | Status: AC
  Filled 2013-04-08: qty 5

## 2013-04-08 MED ORDER — SODIUM BICARBONATE 8.4 % IV SOLN
INTRAVENOUS | Status: DC | PRN
  Administered 2013-04-08: 10 mL via EPIDURAL
  Administered 2013-04-08: 5 mL via EPIDURAL

## 2013-04-08 MED ORDER — SODIUM CHLORIDE 0.9 % IJ SOLN: 3.0000 mL | INTRAMUSCULAR | Status: DC | PRN

## 2013-04-08 MED ORDER — DEXTROSE 5 % IV SOLN
2.0000 g | Freq: Two times a day (BID) | INTRAVENOUS | Status: DC
  Administered 2013-04-09: 2 g via INTRAVENOUS
  Filled 2013-04-08 (×2): qty 2

## 2013-04-08 MED ORDER — MEASLES, MUMPS & RUBELLA VAC ~~LOC~~ INJ
0.5000 mL | INJECTION | Freq: Once | SUBCUTANEOUS | Status: AC
  Administered 2013-04-10: 0.5 mL via SUBCUTANEOUS
  Filled 2013-04-08: qty 0.5

## 2013-04-08 MED ORDER — MENTHOL 3 MG MT LOZG: 1.0000 | LOZENGE | OROMUCOSAL | Status: DC | PRN

## 2013-04-08 MED ORDER — NALOXONE HCL 0.4 MG/ML IJ SOLN: 0.4000 mg | INTRAMUSCULAR | Status: DC | PRN

## 2013-04-08 MED ORDER — DIPHENHYDRAMINE HCL 25 MG PO CAPS: 25.0000 mg | ORAL_CAPSULE | ORAL | Status: DC | PRN

## 2013-04-08 MED ORDER — HYDROMORPHONE HCL PF 1 MG/ML IJ SOLN
INTRAMUSCULAR | Status: AC
  Filled 2013-04-08: qty 1

## 2013-04-08 MED ORDER — FENTANYL CITRATE 0.05 MG/ML IJ SOLN
INTRAMUSCULAR | Status: AC
  Filled 2013-04-08: qty 2

## 2013-04-08 MED ORDER — KETOROLAC TROMETHAMINE 30 MG/ML IJ SOLN: 30.0000 mg | Freq: Four times a day (QID) | INTRAMUSCULAR | Status: AC | PRN

## 2013-04-08 MED ORDER — ACETAMINOPHEN 500 MG PO TABS
1000.0000 mg | ORAL_TABLET | Freq: Four times a day (QID) | ORAL | Status: AC
  Administered 2013-04-09 (×2): 1000 mg via ORAL
  Filled 2013-04-08 (×2): qty 2

## 2013-04-08 MED ORDER — LANOLIN HYDROUS EX OINT: 1.0000 "application " | TOPICAL_OINTMENT | CUTANEOUS | Status: DC | PRN

## 2013-04-08 MED ORDER — AMPICILLIN SODIUM 2 G IJ SOLR
2.0000 g | Freq: Four times a day (QID) | INTRAMUSCULAR | Status: DC
  Administered 2013-04-08: 2 g via INTRAVENOUS
  Filled 2013-04-08 (×2): qty 2000

## 2013-04-08 MED ORDER — ZOLPIDEM TARTRATE 5 MG PO TABS: 5.0000 mg | ORAL_TABLET | Freq: Every evening | ORAL | Status: DC | PRN

## 2013-04-08 MED ORDER — LACTATED RINGERS IV SOLN
INTRAVENOUS | Status: DC | PRN
  Administered 2013-04-08: 19:00:00 via INTRAVENOUS

## 2013-04-08 MED ORDER — TETANUS-DIPHTH-ACELL PERTUSSIS 5-2.5-18.5 LF-MCG/0.5 IM SUSP: 0.5000 mL | Freq: Once | INTRAMUSCULAR | Status: DC

## 2013-04-08 MED ORDER — SIMETHICONE 80 MG PO CHEW
80.0000 mg | CHEWABLE_TABLET | ORAL | Status: DC
  Administered 2013-04-09 – 2013-04-10 (×2): 80 mg via ORAL
  Filled 2013-04-08 (×2): qty 1

## 2013-04-08 MED ORDER — SIMETHICONE 80 MG PO CHEW
80.0000 mg | CHEWABLE_TABLET | Freq: Three times a day (TID) | ORAL | Status: DC
  Administered 2013-04-09 – 2013-04-11 (×6): 80 mg via ORAL
  Filled 2013-04-08 (×6): qty 1

## 2013-04-08 MED ORDER — PHENYLEPHRINE HCL 10 MG/ML IJ SOLN
INTRAMUSCULAR | Status: DC | PRN
  Administered 2013-04-08 (×2): 40 ug via INTRAVENOUS
  Administered 2013-04-08 (×4): 80 ug via INTRAVENOUS

## 2013-04-08 MED ORDER — MORPHINE SULFATE 0.5 MG/ML IJ SOLN
INTRAMUSCULAR | Status: AC
  Filled 2013-04-08: qty 10

## 2013-04-08 MED ORDER — PROMETHAZINE HCL 25 MG/ML IJ SOLN: 6.2500 mg | INTRAMUSCULAR | Status: DC | PRN

## 2013-04-08 MED ORDER — ACETAMINOPHEN 500 MG PO TABS
1000.0000 mg | ORAL_TABLET | Freq: Four times a day (QID) | ORAL | Status: DC | PRN
  Administered 2013-04-08: 1000 mg via ORAL
  Filled 2013-04-08: qty 2

## 2013-04-08 MED ORDER — MIDAZOLAM HCL 2 MG/2ML IJ SOLN: 0.5000 mg | Freq: Once | INTRAMUSCULAR | Status: DC | PRN

## 2013-04-08 MED ORDER — SCOPOLAMINE 1 MG/3DAYS TD PT72
MEDICATED_PATCH | TRANSDERMAL | Status: AC
  Filled 2013-04-08: qty 1

## 2013-04-08 MED ORDER — LACTATED RINGERS IV SOLN
INTRAVENOUS | Status: DC | PRN
  Administered 2013-04-08: 18:00:00 via INTRAVENOUS

## 2013-04-08 MED ORDER — ONDANSETRON HCL 4 MG PO TABS: 4.0000 mg | ORAL_TABLET | ORAL | Status: DC | PRN

## 2013-04-08 MED ORDER — FENTANYL CITRATE 0.05 MG/ML IJ SOLN
25.0000 ug | INTRAMUSCULAR | Status: DC | PRN
  Administered 2013-04-08 (×2): 25 ug via INTRAVENOUS

## 2013-04-08 MED ORDER — MEPERIDINE HCL 25 MG/ML IJ SOLN: 6.2500 mg | INTRAMUSCULAR | Status: DC | PRN

## 2013-04-08 MED ORDER — DEXTROSE IN LACTATED RINGERS 5 % IV SOLN
INTRAVENOUS | Status: DC
  Administered 2013-04-09: 01:00:00 via INTRAVENOUS

## 2013-04-08 MED ORDER — CEFAZOLIN SODIUM-DEXTROSE 2-3 GM-% IV SOLR
INTRAVENOUS | Status: AC
  Filled 2013-04-08: qty 50

## 2013-04-08 MED ORDER — PHENYLEPHRINE 8 MG IN D5W 100 ML (0.08MG/ML) PREMIX OPTIME: INJECTION | INTRAVENOUS | Status: DC | PRN

## 2013-04-08 MED ORDER — GENTAMICIN NICU IV SYRINGE 10 MG/ML
120.0000 mg | Freq: Three times a day (TID) | INTRAMUSCULAR | Status: DC
  Filled 2013-04-08 (×3): qty 12

## 2013-04-08 MED ORDER — ONDANSETRON HCL 4 MG/2ML IJ SOLN: 4.0000 mg | INTRAMUSCULAR | Status: DC | PRN

## 2013-04-08 MED ORDER — IBUPROFEN 600 MG PO TABS
600.0000 mg | ORAL_TABLET | Freq: Four times a day (QID) | ORAL | Status: DC
  Administered 2013-04-09 – 2013-04-11 (×9): 600 mg via ORAL
  Filled 2013-04-08 (×9): qty 1

## 2013-04-08 MED ORDER — DIPHENHYDRAMINE HCL 50 MG/ML IJ SOLN: 25.0000 mg | INTRAMUSCULAR | Status: DC | PRN

## 2013-04-08 MED ORDER — SENNOSIDES-DOCUSATE SODIUM 8.6-50 MG PO TABS
2.0000 | ORAL_TABLET | ORAL | Status: DC
  Administered 2013-04-09 – 2013-04-10 (×2): 2 via ORAL
  Filled 2013-04-08 (×2): qty 2

## 2013-04-08 MED ORDER — SIMETHICONE 80 MG PO CHEW: 80.0000 mg | CHEWABLE_TABLET | ORAL | Status: DC | PRN

## 2013-04-08 MED ORDER — 0.9 % SODIUM CHLORIDE (POUR BTL) OPTIME
TOPICAL | Status: DC | PRN
  Administered 2013-04-08: 1000 mL

## 2013-04-08 MED ORDER — MORPHINE SULFATE (PF) 0.5 MG/ML IJ SOLN
INTRAMUSCULAR | Status: DC | PRN
  Administered 2013-04-08: 4 mg via EPIDURAL

## 2013-04-08 MED ORDER — NALOXONE HCL 1 MG/ML IJ SOLN: 1.0000 ug/kg/h | INTRAVENOUS | Status: DC | PRN

## 2013-04-08 SURGICAL SUPPLY — 34 items
APL SKNCLS STERI-STRIP NONHPOA (GAUZE/BANDAGES/DRESSINGS) ×1
BENZOIN TINCTURE PRP APPL 2/3 (GAUZE/BANDAGES/DRESSINGS) ×2 IMPLANT
CLAMP CORD UMBIL (MISCELLANEOUS) IMPLANT
CLOTH BEACON ORANGE TIMEOUT ST (SAFETY) ×2 IMPLANT
DRAPE LG THREE QUARTER DISP (DRAPES) ×2 IMPLANT
DRSG OPSITE POSTOP 4X10 (GAUZE/BANDAGES/DRESSINGS) ×2 IMPLANT
DURAPREP 26ML APPLICATOR (WOUND CARE) ×2 IMPLANT
ELECT REM PT RETURN 9FT ADLT (ELECTROSURGICAL) ×2
ELECTRODE REM PT RTRN 9FT ADLT (ELECTROSURGICAL) ×1 IMPLANT
EXTRACTOR VACUUM M CUP 4 TUBE (SUCTIONS) IMPLANT
GLOVE BIOGEL PI IND STRL 7.0 (GLOVE) ×1 IMPLANT
GLOVE BIOGEL PI INDICATOR 7.0 (GLOVE) ×1
GLOVE ECLIPSE 7.0 STRL STRAW (GLOVE) ×4 IMPLANT
GOWN PREVENTION PLUS XLARGE (GOWN DISPOSABLE) ×2 IMPLANT
GOWN STRL REIN XL XLG (GOWN DISPOSABLE) ×2 IMPLANT
KIT ABG SYR 3ML LUER SLIP (SYRINGE) IMPLANT
NDL HYPO 25X5/8 SAFETYGLIDE (NEEDLE) IMPLANT
NEEDLE HYPO 22GX1.5 SAFETY (NEEDLE) ×2 IMPLANT
NEEDLE HYPO 25X5/8 SAFETYGLIDE (NEEDLE) IMPLANT
NS IRRIG 1000ML POUR BTL (IV SOLUTION) ×2 IMPLANT
PACK C SECTION WH (CUSTOM PROCEDURE TRAY) ×2 IMPLANT
PAD ABD 7.5X8 STRL (GAUZE/BANDAGES/DRESSINGS) ×1 IMPLANT
PAD OB MATERNITY 4.3X12.25 (PERSONAL CARE ITEMS) ×2 IMPLANT
RTRCTR C-SECT PINK 25CM LRG (MISCELLANEOUS) ×1 IMPLANT
STAPLER VISISTAT 35W (STAPLE) IMPLANT
STRIP CLOSURE SKIN 1/2X4 (GAUZE/BANDAGES/DRESSINGS) ×2 IMPLANT
SUT VIC AB 0 CTX 36 (SUTURE) ×6
SUT VIC AB 0 CTX36XBRD ANBCTRL (SUTURE) ×3 IMPLANT
SUT VIC AB 4-0 KS 27 (SUTURE) ×1 IMPLANT
SYR 30ML LL (SYRINGE) ×2 IMPLANT
TAPE CLOTH SURG 4X10 WHT LF (GAUZE/BANDAGES/DRESSINGS) ×2 IMPLANT
TOWEL OR 17X24 6PK STRL BLUE (TOWEL DISPOSABLE) ×2 IMPLANT
TRAY FOLEY CATH 14FR (SET/KITS/TRAYS/PACK) ×2 IMPLANT
WATER STERILE IRR 1000ML POUR (IV SOLUTION) ×1 IMPLANT

## 2013-04-08 NOTE — Transfer of Care (Signed)
Immediate Anesthesia Transfer of Care Note  Patient: Emily Andrade  Procedure(s) Performed: Procedure(s): CESAREAN SECTION (N/A)  Patient Location: PACU  Anesthesia Type:Epidural  Level of Consciousness: awake, alert  and oriented  Airway & Oxygen Therapy: Patient Spontanous Breathing  Post-op Assessment: Report given to PACU RN and Post -op Vital signs reviewed and stable  Post vital signs: Reviewed and stable  Complications: No apparent anesthesia complications

## 2013-04-08 NOTE — Progress Notes (Signed)
ANTIBIOTIC CONSULT NOTE - INITIAL  Pharmacy Consult for Gentamicin Indication: Chorioamnionitis   No Known Allergies  Patient Measurements: Height: 5\' 1"  (154.9 cm) Weight: 160 lb (72.576 kg) IBW/kg (Calculated) : 47.8 kg Adjusted Body Weight: 55 kg  Vital Signs: Temp: 101.2 F (38.4 C) (12/05 1504) Temp src: Axillary (12/05 1504) BP: 132/68 mmHg (12/05 1531) Pulse Rate: 74 (12/05 1531)  Labs:  Recent Labs  04/05/13 2015  WBC 6.6  HGB 11.3*  PLT 195      Medications:  Ampicillin 2 grams IV Q 6 hr  Assessment: 32 y.o. female G1P0000 at [redacted]w[redacted]d with fever and suspected chorioamnionitis Estimated Ke = 0.259, Vd = 0.34 SCr not available so estimated CrCl to be 86 ml/min with estimated SCr 0.7  Goal of Therapy:  Gentamicin peak 6-8 mg/L and Trough < 1 mg/L  Plan:  Gentamicin 120 mg IV every 8 hrs  Check Scr with next labs if gentamicin continued. Will check gentamicin levels if continued > 72hr or clinically indicated.  Natasha Bence 04/08/2013,3:47 PM

## 2013-04-08 NOTE — Op Note (Signed)
Preoperative Diagnosis:  IUP @ [redacted]w[redacted]d, Arrest of Dilation  Postoperative Diagnosis:  Same  Procedure: Primary low transverse cesarean section  Surgeon: Tinnie Gens, M.D.  Assistant: None  Findings: Viable female infant, APGAR (1 MIN): 6   APGAR (5 MINS): 9   vertex presentation, Asynclitic ROT position, MSF, nml appearing tubes  Estimated blood loss: 1000 cc  Complications: None known  Specimens: Placenta to pathology  Reason for procedure: Briefly, the patient is a 32 y.o. G1P1001 [redacted]w[redacted]d who presents for IOL secondary to post-dates. She received cytotec, followed by foley bulb placement, AROM and Pitocin.  AROM x 37 hours.  No change in cervical dilation x 15 hours at 7.5 cm despite, pitocin rest, position change.  Never had adequate contractions even on 40 milliunits of Pitocin.  Developed a temperature and was placed on Antibiotics.  After persistently failing to achieve labor and no such a protracted active phase, decision was made to proceed with abdominal delivery.  Procedure: Patient is a to the OR where spinal analgesia was administered. She was then placed in a supine position with left lateral tilt. She received 2 g of Ancef and SCDs were in place. A timeout was performed. She was prepped and draped in the usual sterile fashion. A Foley catheter was present in the bladder. A knife was then used to make a Pfannenstiel incision. This incision was carried out to underlying fascia which was divided in the midline with the knife. The incision was extended laterally, bluntly.  The rectus was divided in the midline.  The peritoneal cavity was entered bluntly.  Alexis retractor was placed inside the incision.  A knife was used to make a low transverse incision on the uterus. This incision was carried down to the amniotic cavity was entered. Fetus was in ROT position and was brought up out of the incision without difficulty. Cord was clamped x 2 and cut. Infant taken to waiting pediatrician.  Cord  blood was obtained. Placenta was delivered from the uterus.  Uterus was cleaned with dry lap pads. Uterine incision closed with 0 Vicryl suture in a locked running fashion. A second layer of 0 Vicryl in an imbricating fashion was used to achieve hemostasis. Alexis retractor was removed from the abdomen. Peritoneal closure was done with 0 Vicryl suture.  Fascia is closed with 0 Vicryl suture in a running fashion. Subcutaneous tissue infused with 30cc 0.25% Marcaine.  Skin closed using 3-0 Vicryl on a Keith needle.  Steri strips applied, followed by pressure dressing.  All instrument, needle and lap counts were correct x 2.  Patient was awake and taken to PACU stable.  Infant skin to skin with mom, stable.   Emary Zalar SMD 04/08/2013 7:12 PM

## 2013-04-08 NOTE — Progress Notes (Signed)
Emily Andrade is a 32 y.o. G1P0000 at [redacted]w[redacted]d by ultrasound admitted for induction of labor due to Post dates. Due date 03/25/2013.  Subjective:   Objective: BP 110/75  Pulse 78  Temp(Src) 99.7 F (37.6 C) (Axillary)  Resp 16  Ht 5\' 1"  (1.549 m)  Wt 160 lb (72.576 kg)  BMI 30.25 kg/m2  SpO2 100%  LMP 06/18/2012 I/O last 3 completed shifts: In: -  Out: 725 [Urine:725] Total I/O In: -  Out: 150 [Urine:150]  FHT:  FHR: 150 bpm, variability: moderate,  accelerations:  Present,  decelerations:  Absent UC:   irregular, every 5-8 minutes SVE:   Dilation:  (unchanged) Effacement (%): 90 (swollen) Station: 0 Exam by:: Dr. Shawnie Pons  Labs: Lab Results  Component Value Date   WBC 6.6 04/05/2013   HGB 11.3* 04/05/2013   HCT 34.1* 04/05/2013   MCV 86.3 04/05/2013   PLT 195 04/05/2013    Assessment / Plan: Arrest in active phase of labor  Labor: No progression x > 15 hours Fetal Wellbeing:  Category II Pain Control:  Epidural I/D:  chorioamnionitis on Abx Anticipated MOD:  after lengthy discussion about lack of cervical dilation, will proceed with abdominal delivery. Risks include but are not limited to bleeding, infection, injury to surrounding structures, including bowel, bladder and ureters, blood clots, and death.  Likelihood of success is high.   Tangie Stay S 04/08/2013, 5:54 PM

## 2013-04-08 NOTE — Progress Notes (Signed)
   Emily Andrade is a 32 y.o. G1P0000 at [redacted]w[redacted]d  admitted for induction of labor due to Post dates. .  Subjective: Comfortable with epidural  Objective: BP 123/76  Pulse 59  Temp(Src) 97.7 F (36.5 C) (Oral)  Resp 20  Ht 5\' 1"  (1.549 m)  Wt 72.576 kg (160 lb)  BMI 30.25 kg/m2  SpO2 100%  LMP 06/18/2012    FHT:  FHR: 140 bpm, variability: moderate,  accelerations:  Present,  decelerations:  Absent UC:   MVU's ~ 130, but 180 in last 20 minutes SVE:   Dilation: 6.5 Effacement (%): 90 Station: -1 Exam by:: f. dishmno cresenzo Pitocin @ 40 mu/min  Labs: Lab Results  Component Value Date   WBC 6.6 04/05/2013   HGB 11.3* 04/05/2013   HCT 34.1* 04/05/2013   MCV 86.3 04/05/2013   PLT 195 04/05/2013    Assessment / Plan: IOL for postdates, barely adequate labor, but making cervical change  Labor: Progressing normally but slowly Fetal Wellbeing:  Category I Pain Control:  Epidural Anticipated MOD:  NSVD  CRESENZO-DISHMAN,Emily Andrade 04/08/2013, 1:29 AM

## 2013-04-08 NOTE — Progress Notes (Signed)
   Siah Kannan is a 32 y.o. G1P0000 at [redacted]w[redacted]d  admitted for induction of labor due to Post dates. .  Subjective: Comfortable with epidural  Objective: BP 123/76  Pulse 59  Temp(Src) 97.7 F (36.5 C) (Oral)  Resp 20  Ht 5\' 1"  (1.549 m)  Wt 72.576 kg (160 lb)  BMI 30.25 kg/m2  SpO2 100%  LMP 06/18/2012    FHT:  FHR: 140 bpm, variability: moderate,  accelerations:  Present,  decelerations:  Absent UC:   MVU's ~ 180 SVE: 8-9 with a thick anterior lip/100/0 sta   Pitocin @ 40 mu/min  Labs: Lab Results  Component Value Date   WBC 6.6 04/05/2013   HGB 11.3* 04/05/2013   HCT 34.1* 04/05/2013   MCV 86.3 04/05/2013   PLT 195 04/05/2013    Assessment / Plan: IOL for postdates, making progress Labor: Progressing normally but slowly Fetal Wellbeing:  Category I Pain Control:  Epidural Anticipated MOD:  NSVD  CRESENZO-DISHMAN,Hazem Kenner 04/08/2013, 1:32 AM

## 2013-04-08 NOTE — Consult Note (Signed)
Neonatology Note:   Attendance at C-section:    I was asked by Dr. Shawnie Pons to attend this primary C/S at [redacted] weeks GA due to failed induction. The mother is a G1P0 O pos, Rubella equivocal, and GBS neg with induction for post-dates. ROM 37 hours prior to delivery, fluid with moderate meconium. She began to have temp elevation to 101.2 degrees this afternoon and received a dose of Ampicillin 3 hours before delivery and Gentamicin 2 hours before delivery. Infant a little floppy at birth but with a spontaneous cry. Needed bulb suctioning of bloody secretions. Breath sounds equal but with some upper airway noise, so DeLee suctioned for removal of 6 ml dark green fluid, after which he sounded clear. Ap 6/9. To CN to care of Pediatrician.   Doretha Sou, MD

## 2013-04-08 NOTE — Progress Notes (Signed)
Emily Andrade is a 32 y.o. G1P0000 at [redacted]w[redacted]d admitted for induction of labor due to Post dates.  Subjective: Patient doing well, reports some unilateral discomfort in back and hips when having contractions. Patient otherwise a little bit frustrated with slow progression but does not desire a cesarean section  Objective: BP 97/56  Pulse 68  Temp(Src) 97.2 F (36.2 C) (Oral)  Resp 20  Ht 5\' 1"  (1.549 m)  Wt 160 lb (72.576 kg)  BMI 30.25 kg/m2  SpO2 100%  LMP 06/18/2012 I/O last 3 completed shifts: In: -  Out: 725 [Urine:725]    FHT:  FHR: 135 bpm, variability: moderate,  accelerations:  Present,  decelerations:  Absent UC:   regular, every 3-4 minutes SVE:   Dilation: 8 Effacement (%): 80 Station: 0 Exam by:: a. white rn Confirmed with thick anterior lip Labs: Lab Results  Component Value Date   WBC 6.6 04/05/2013   HGB 11.3* 04/05/2013   HCT 34.1* 04/05/2013   MCV 86.3 04/05/2013   PLT 195 04/05/2013    Assessment / Plan: Protracted active phase  Fetal Wellbeing:  Category I Pain Control:  Epidural Anticipated MOD:  NSVD Will continue with pitocin augmentation in order to reach adequate contraction pattern. Discussed with patient slow progression and lack of cervical change for the past 5 hours. Patient verbalized understanding but is not ready to discuss delivery via cesarean section  Emily Andrade 04/08/2013, 7:26 AM

## 2013-04-08 NOTE — Progress Notes (Signed)
Emily Andrade is a 32 y.o. G1P0000 at [redacted]w[redacted]d admitted for induction of labor due to Post dates.   Subjective: Pt comfortable with epidural.  Pt has reported intermittent abdominal pain every few hours. Epidural bolused several times during day, pt repositioned, and pain resolved.   Objective: BP 125/62  Pulse 74  Temp(Src) 101.2 F (38.4 C) (Axillary)  Resp 16  Ht 5\' 1"  (1.549 m)  Wt 72.576 kg (160 lb)  BMI 30.25 kg/m2  SpO2 100%  LMP 06/18/2012 I/O last 3 completed shifts: In: -  Out: 725 [Urine:725] Total I/O In: -  Out: 150 [Urine:150]  FHT:  FHR: 135 bpm, variability: moderate,  accelerations:  Present,  decelerations:  Absent UC:   Irregular, inadequate per IUPC when on Pitocin, Pitocin off and contractions stopped entirely SVE:   Dilation: 8.5 Effacement (%): 90 (swollen) Station: 0 Exam by:: Misty Stanley Leftwich-Kurby Left posterior ascinclinticism noted, large swollen anterior lip, some swelling of cervix all the way around  Pitocin on 40 milliunits/min with inadequate contractions.  Pitocin break provided.  Pt having abdominal/back pain at time of exam.  Pt repositioned and epidural bolused.  Then, pt in trendelenburg 15-20 minutes to encourage fetal head malposition to correct, then Pitocin restarted, and lateral sims, exaggerated to encourage rotation.    RN called to report fever of 101.    Labs: Lab Results  Component Value Date   WBC 6.6 04/05/2013   HGB 11.3* 04/05/2013   HCT 34.1* 04/05/2013   MCV 86.3 04/05/2013   PLT 195 04/05/2013    Assessment / Plan: Induction of labor due to postdates Pitocin restarted, pt repositioned Ampicillin/Gentamycin IV for chorioamnionitis  Labor: malpositioned fetus with ascincliticism Preeclampsia:  n/a Fetal Wellbeing:  Category I Pain Control:  Epidural I/D:  n/a Anticipated MOD:  unknown  Andrade, Emily Staton 04/08/2013, 3:29 PM

## 2013-04-09 LAB — TYPE AND SCREEN
Antibody Screen: NEGATIVE
Unit division: 0

## 2013-04-09 LAB — CBC
HCT: 25.1 % — ABNORMAL LOW (ref 36.0–46.0)
Hemoglobin: 8.5 g/dL — ABNORMAL LOW (ref 12.0–15.0)
MCH: 29 pg (ref 26.0–34.0)
MCV: 85.7 fL (ref 78.0–100.0)
RBC: 2.93 MIL/uL — ABNORMAL LOW (ref 3.87–5.11)
WBC: 14 10*3/uL — ABNORMAL HIGH (ref 4.0–10.5)

## 2013-04-09 MED ORDER — DEXTROSE 5 % IN LACTATED RINGERS IV BOLUS: 700.0000 mL | Freq: Once | INTRAVENOUS | Status: DC

## 2013-04-09 NOTE — Lactation Note (Addendum)
This note was copied from the chart of Emily Johana Martinez-Garcia. Lactation Consultation Note: Initial visit with mom. Called to assist with latch. Mom attempting to latch baby to right nipple in cradle hold. Assisted mom with latch in football hold baby took several tries but finally latched and mom reports that she feels tugging, no pain. Baby doing some tongue sucking. Dad translating for me. Reviewed wide open mouth and keeping the baby close to the breast throughout the feeding. Spanish BF brochure left with parents. Has manual pump-easily able to express Colostrum. Left nipple is flat. Baby going off to sleep. Will try on left breast at next feeding. Encouraged to call for assist prn. No questions at present.   Patient Name: Emily Andrade Date: 04/09/2013 Reason for consult: Initial assessment   Maternal Data Formula Feeding for Exclusion: Yes Reason for exclusion: Mother's choice to formula and breast feed on admission Infant to breast within first hour of birth: Yes Has patient been taught Hand Expression?: Yes Does the patient have breastfeeding experience prior to this delivery?: No  Feeding Feeding Type: Breast Fed Length of feed: 10 min  LATCH Score/Interventions Latch: Repeated attempts needed to sustain latch, nipple held in mouth throughout feeding, stimulation needed to elicit sucking reflex.  Audible Swallowing: A few with stimulation  Type of Nipple: Everted at rest and after stimulation (Left nipple flat) Intervention(s): Hand pump  Comfort (Breast/Nipple): Soft / non-tender     Hold (Positioning): Assistance needed to correctly position infant at breast and maintain latch. Intervention(s): Breastfeeding basics reviewed;Support Pillows;Position options  LATCH Score: 7  Lactation Tools Discussed/Used     Consult Status Consult Status: Follow-up Date: 04/10/13 Follow-up type: In-patient    Pamelia Hoit 04/09/2013, 10:53 AM

## 2013-04-09 NOTE — Anesthesia Postprocedure Evaluation (Signed)
  Anesthesia Post-op Note  Anesthesia Post Note  Patient: Emily Andrade  Procedure(s) Performed: Procedure(s) (LRB): CESAREAN SECTION (N/A)  Anesthesia type: Epidural  Patient location: PACU  Post pain: Pain level controlled  Post assessment: Post-op Vital signs reviewed  Post vital signs: stable  Level of consciousness: awake  Complications: No apparent anesthesia complications

## 2013-04-09 NOTE — Progress Notes (Signed)
Subjective: Postpartum Day 1: Cesarean Delivery Patient reports nausea, vomiting, incisional pain and tolerating PO.    Objective: Vital signs in last 24 hours: Temp:  [97.1 F (36.2 C)-101.2 F (38.4 C)] 97.1 F (36.2 C) (12/06 0602) Pulse Rate:  [61-99] 74 (12/06 0602) Resp:  [13-21] 18 (12/06 0602) BP: (83-152)/(53-84) 92/55 mmHg (12/06 0602) SpO2:  [95 %-98 %] 97 % (12/06 0602)  Physical Exam:  General: alert, cooperative and no distress Lochia: appropriate Uterine Fundus: firm Incision: dressing dry DVT Evaluation: No evidence of DVT seen on physical exam.   Recent Labs  04/09/13 0555  HGB 8.5*  HCT 25.1*    Assessment/Plan: Status post Cesarean section. Doing well postoperatively.  Continue current care.  EURE,LUTHER H 04/09/2013, 10:18 AM

## 2013-04-09 NOTE — Progress Notes (Signed)
Clinical Social Work Department PSYCHOSOCIAL ASSESSMENT - MATERNAL/CHILD 04/09/2013  Patient:  Emily Andrade, Emily Andrade  Account Number:  [id number]  Admit Date:  04/05/2013  Marjo Bicker Name:   Roxanne Gates    Clinical Social Worker:  Burak Zerbe, LCSW   Date/Time:  04/09/2013 11:00 AM  Date Referred:  04/08/2013   Referral source  Central Nursery     Referred reason  Depression/Anxiety   Other referral source:    I:  FAMILY / HOME ENVIRONMENT Child's legal guardian:  PARENT  Guardian - Name Guardian - Age Guardian - Address  Andrade,Emily [address]  [address] [place], [place] [address]  Orie Rout  same as above   Other household support members/support persons Other support:    II  PSYCHOSOCIAL DATA Information Source:    Event organiser Employment:   Father is employed   Surveyor, quantity resources:  [employer] If [employer]:   Other  WIC   School / Grade:   Maternity Care Coordinator / Child Services Coordination / Early Interventions:  Cultural issues impacting care:    III  STRENGTHS Strengths  Supportive family/friends  Home prepared for Child (including basic supplies)  Adequate Resources   Strength comment:    IV  RISK FACTORS AND CURRENT PROBLEMS Current Problem:     Risk Factor & Current Problem Patient Issue Family Issue Risk Factor / Current Problem Comment   N N     V  SOCIAL WORK ASSESSMENT Acknowledged order for Social Work consult to assess mother's history of depression.  Spanish translator Shanda Bumps facilitated the interview.  Mother was pleasant and receptive to social work intervention.  Parents are married.  Spouse was present during social work visit. Mother states that she has a hx of depression and has received treatment for a short period of time in 2010-09-17. Informed that her uncle died in 2006-09-17 and her father died in 09-17-10.  Informed that she was prescribed an antidepressant which she took  for a few months in 09/17/2010.  She denies any hx of psychiatric hospitalization.      Mother denies any depressive symptoms during pregnancy.  She also reports no current depressive symptoms or anxiety at this time.   She seems very excited about newborn.    Spoke with her regarding PPD and provided her with information and resources.    She also denies any hx of substance abuse. Father plans to take time off from work.  No acute social concerns noted or reported at this time.  Informed her of social work Surveyor, mining.      VI SOCIAL WORK PLAN Social Work Plan  No Further Intervention Required / No Barriers to Discharge   Corinthian Kemler J, LCSW

## 2013-04-09 NOTE — Anesthesia Postprocedure Evaluation (Signed)
Anesthesia Post Note  Patient: Emily Andrade  Procedure(s) Performed: Procedure(s) (LRB): CESAREAN SECTION (N/A)  Anesthesia type: Epidural  Patient location: Mother/Baby  Post pain: Pain level controlled  Post assessment: Post-op Vital signs reviewed  Last Vitals:  Filed Vitals:   04/09/13 0602  BP: 92/55  Pulse: 74  Temp: 36.2 C  Resp: 18    Post vital signs: Reviewed  Level of consciousness: awake  Complications: No apparent anesthesia complications

## 2013-04-10 MED ORDER — OXYCODONE-ACETAMINOPHEN 5-325 MG PO TABS: 1.0000 | ORAL_TABLET | ORAL | Status: DC | PRN

## 2013-04-10 MED ORDER — IBUPROFEN 600 MG PO TABS: 600.0000 mg | ORAL_TABLET | Freq: Four times a day (QID) | ORAL | Status: DC | PRN

## 2013-04-10 NOTE — Lactation Note (Signed)
This note was copied from the chart of Boy Julie-Anne Martinez-Garcia. Lactation Consultation Note  Patient Name: Boy Itzabella Sorrels [id number] Date: 04/10/2013   Visited with Mom, baby at 15 hrs old.  Mom was trying to feed baby on left breast, using the cradle hold, while seated on the couch.  Baby not supported by pillow, but being helped at the breast by FOB.  Mom using scissor hold on breast.  Baby latched onto nipple, and Mom complaining of significant discomfort.  Offered to assist with trying to get baby to latch more deeply.  Set Mom up using pillows behind her for support, as well as pillows stacked to her side for football hold.  Instructed and guided Mom's hands away from holding onto breast near her nipple, explaining the importance of baby latching onto areola.  Baby became very fussy, and was popping on and off the breast.  He would open his mouth widely, but unable to sustain an adequate latch. If latch was attained, baby would stop sucking due to probable little stimulation to suckle (short nipple). Manual breast expression done,and taught to Mom.  Mom pleased to see colostrum expressed from her breast.  Initiated a 24 mm nipple shield, with instructions on use and care.  Baby latched after several attempts, and fed on and off for about 20 mins, with LC's assistance the entire time.  Colostrum in the shield noted, along with a couple swallows heard.  Mom needing a lot of guidance to latch baby deeply, without baby pushing on and off the nipple shield.  Mom to continue to wear breast shell on the left side, to provide reverse pressure softening, and evert nipple.  To observe baby latched onto right side at subsequent feedings.  Discussed with MD that [facility] recommends further assistance inpatient with feedings before being discharged.  Maternal Data    Feeding Feeding Type: Bottle Fed - Formula Length of feed: 30 min  LATCH Score/Interventions Latch: Grasps breast easily, tongue down, lips  flanged, rhythmical sucking.  Audible Swallowing: A few with stimulation  Type of Nipple: Everted at rest and after stimulation  Comfort (Breast/Nipple): Soft / non-tender     Hold (Positioning): Assistance needed to correctly position infant at breast and maintain latch.  LATCH Score: 8  Lactation Tools Discussed/Used     Consult Status      Judee Clara 04/10/2013, 9:21 PM

## 2013-04-10 NOTE — Anesthesia Postprocedure Evaluation (Signed)
  Anesthesia Post-op Note  Anesthesia Post Note  Patient: Emily Andrade  Procedure(s) Performed: Procedure(s) (LRB): CESAREAN SECTION (N/A)  Anesthesia type: Epidural  Patient location: Mother/Baby  Post pain: Pain level controlled  Post assessment: Post-op Vital signs reviewed  Last Vitals:  Filed Vitals:   04/10/13 0505  BP: 101/63  Pulse: 63  Temp: 35.9 C  Resp: 18    Post vital signs: Reviewed  Level of consciousness:alert  Complications: No apparent anesthesia complications

## 2013-04-10 NOTE — Discharge Instructions (Signed)
Parto por cesárea - Cuidados posteriores  °(Cesarean Delivery, Care After) °Siga estas instrucciones durante las próximas semanas. Estas indicaciones le proporcionan información general acerca de cómo deberá cuidarse después del procedimiento. El médico también podrá darle instrucciones más específicas. El tratamiento se ha planificado de acuerdo a las prácticas médicas actuales, pero a veces se producen problemas. Comuníquese con el médico si tiene algún problema o tiene dudas cuando vuelva a su casa.  °INSTRUCCIONES PARA EL CUIDADO EN EL HOGAR  °· Tome sólo medicamentos de venta libre o recetados, según las indicaciones del médico. °· No beba alcohol, especialmente si está amamantando o toma analgésicos. °· Nomastique tabaco ni fume. °· Continúe con un adecuado cuidado perineal. El buen cuidado perineal incluye: °· Higienizarse de adelante hacia atrás. °· Mantener la zona perineal limpia. °· Controlar diariamente el corte (incisión) y observar si aumenta el enrojecimiento, si supura, se hincha o se separa la piel. °· Limpie la incisión suavemente con jabón y agua todos los días, y luego séquela dando golpecitos. Si el médico la autoriza, deje la incisión al descubierto. Use un apósito (vendaje) si drena líquido o la incisión parece irritada. Si las pequeñas tiras adhesivas que cruzan la incisión no se caen dentro de los 7 días, retírelas suavemente. °· Abrace una almohada al toser o estornudar hasta que la incisión se cure. Esto ayuda a aliviar el dolor. °· No conduzca vehículos ni opere maquinarias hasta que el médico la autorice. °· Dúchese, lávese el cabello y tome baños de inmersión según las indicaciones de su médico. °· Utilice un sostén que le ajuste bien y que brinde buen soporte a sus mamas. °· Limite el uso de bombachas de sostén o medias panty. °· Beba suficiente líquido para mantener la orina clara o de color amarillo pálido. °· Consuma todos los días alimentos ricos en fibra como cereales y panes  integrales, arroz, frijoles y frutas frescas y verduras. Estos alimentos pueden ayudarla a prevenir o aliviar el estreñimiento. °· Reanude las actividades como subir escaleras, conducir automóviles, levantar objetos pesados, hacer ejercicios o viajar cuando le indique su médico. °· Hable con su médico acerca de reanudar la actividad sexual. Volver a la actividad sexual depende del riesgo de infección, la velocidad de la curación y la comodidad y su deseo de reanudarla. °· Trate de que alguien la ayude con las actividades del hogar y con el recién nacido al menos durante algunos días después de salir del hospital. °· Descanse todo lo que pueda. Trate de descansar o tomar una siesta mientras el bebé está durmiendo. °· Aumente sus actividades gradualmente. °· Cumpla con todos los controles programados para después del parto. Es muy importante asistir a todas las visitas de control programadas. En estas visitas, su médico va a controlarla para asegurarse de que esté sanando física y emocionalmente. °SOLICITE ATENCIÓN MÉDICA SI:  °· Elimina coágulos grandes por la vagina. Guarde algunos coágulos para mostrarle al médico. °· Tiene una secreción con feo olor que proviene de la vagina. °· Tiene dificultad para orinar. °· Orina con frecuencia. °· Siente dolor al orinar. °· Nota un cambio en sus movimientos intestinales. °· Aumenta el enrojecimiento, el dolor o la hinchazón en la zona de la incisión. °· Observa que supura pus en la incisión. °· La incisión se abre. °· Sus mamas le duelen, están duras o enrojecidas. °· Sufre un dolor intenso de cabeza. °· Tiene visión borrosa o ve manchas. °· Se siente triste o deprimida. °· Tiene pensamientos acerca de lastimarse o dañar al   recin nacido.  Tiene preguntas acerca de su cuidado, la atencin del recin nacido o acerca de los medicamentos.  Se siente mareada o sufre un desmayo.  Tiene una erupcin.  Siente dolor u observa enrojecimiento o hinchazn en el sitio en que  estaba la va intravenosa (IV).  Tiene nuseas o vmitos.  Usted dej de amamantar al beb y no ha tenido su perodo menstrual dentro de las 12 semanas siguientes.  No amamanta al beb y no tuvo su perodo menstrual en las ltimas 12 semanas.  Tiene fiebre. SOLICITE ATENCIN MDICA DE INMEDIATO SI:   Siente dolor persistente.  Siente dolor en el pecho.  Le falta el aire.  Se desmaya.  Siente dolor en la pierna.  Siente Physiological scientist.  El sangrado vaginal satura dos o ms apsitos en 1 hora. ASEGRESE DE QUE:   Comprende estas instrucciones.  Controlar su enfermedad.  Recibir ayuda de inmediato si no mejora o si empeora. Document Released: 04/21/2005 Document Revised: 12/22/2012 [facility] Patient Information 2014 [place], [place].  Lactancia materna  (Breastfeeding)  El cambio hormonal durante el Psychiatrist produce el desarrollo del tejido [place] y un aumento en el nmero y tamao de los conductos galactforos. La hormona prolactina permite que las protenas, los azcares y las grasas de la sangre produzcan la [employer] materna en las glndulas productoras de [place]. La hormona progesterona impide que la leche materna sea liberada antes del nacimiento del beb. Despus del nacimiento del beb, su nivel de progesterona disminuye permitiendo que la leche materna sea [place]. Pensar en el beb, as como la succin o Theatre manager, pueden estimular la liberacin de [place] de las glndulas productoras de [place].  La decisin de Company secretary) es una de las mejores opciones que usted puede hacer para usted y su beb. La informacin que sigue da una breve resea de los beneficios, as Lexicographer que debe saber sobre la [place].  LOS BENEFICIOS DE AMAMANTAR  Para el beb   La primera leche (calostro) ayuda al mejor funcionamiento del sistema digestivo del beb.   La leche tiene anticuerpos que provienen de la madre y que ayudan a prevenir las  infecciones en el beb.   El beb tiene una menor incidencia de asma, alergias y del sndrome de muerte sbita del lactante (SMSL).   Los nutrientes de la [place] materna son mejores para el beb que la [place].  La leche materna mejora el desarrollo cerebral del beb.   Su beb tendr menos gases, clicos y estreimiento.  Es menos probable que el beb desarrolle otras enfermedades, como obesidad infantil, asma o diabetes mellitus. Para usted   La lactancia materna favorece el desarrollo de un vnculo muy especial entre la madre y el beb.   Es ms conveniente, siempre disponible, a la [place] y [place].   La lactancia materna ayuda a quemar caloras y a perder el peso ganado durante el [place].   Hace que el tero se contraiga ms rpidamente a su tamao normal y [employer] sangrado despus del [place].   Las [employer] que amamantan tienen menos riesgo de Environmental education officer osteoporosis o cncer de mama o de ovario en el futuro.  FRECUENCIA DEL AMAMANTAMIENTO   Un beb sano, nacido a trmino, puede amamantarse con tanta frecuencia como cada hora, o espaciar las comidas cada tres horas. La frecuencia en la lactancia varan de un beb a otro.   Los recin nacidos deben ser alimentados por lo menos cada 2-3 horas durante  el da y cada 4-5 horas durante la noche. Usted debe amamantarlo un mnimo de 8 tomas en un perodo de 24 horas.  Despierte al beb para amamantarlo si han pasado 3-4 horas desde la ltima comida.  Amamante cuando sienta la necesidad de reducir la plenitud de sus senos o cuando el beb muestre signos de [place]. Las seales de que el beb puede Gentry Fitz son:  Lenora Boys su estado de alerta o vigilancia.  Se estira.  Mueve la cabeza de un lado a otro.  Mueve la cabeza y abre la boca cuando se le toca la mejilla o la boca (reflejo de succin).  Aumenta las vocalizaciones, tales como sonidos de succin, relamerse los labios, arrullos,  suspiros, o chirridos.  Mueve la [employer] boca.  Se chupa con ganas los dedos o las manos.  Agitacin.  Llanto intermitente.  Los signos de hambre extrema requerirn que lo calme y lo consuele antes de tratar de alimentarlo. Los signos de hambre extrema son:  Agitacin.  Llanto fuerte e intenso.  Gritos.  El amamantamiento frecuente la ayudar a producir ms [place] y a Education officer, community de Engineer, mining en los pezones e hinchazn de las [place].  LACTANCIA MATERNA   Ya sea que se encuentre acostada o sentada, asegrese que el abdomen del beb est enfrente el suyo.   Sostenga la mama con el pulgar por arriba y los otros 4 dedos por debajo del pezn. Asegrese que sus dedos se encuentren lejos del pezn y de la boca del beb.   Empuje suavemente los labios del beb con el pezn o con el dedo.   Cuando la boca del beb se abra lo suficiente, introduzca el pezn y la zona oscura que lo rodea (areola) tanto como le sea posible dentro de la boca.  Debe haber ms areola visible por arriba del labio superior que por debajo del labio inferior.  La lengua del beb debe estar entre la enca inferior y el seno.  Asegrese de que la boca del beb est en la posicin correcta alrededor del pezn (prendida). Los labios del beb deben crear un sello sobre su pecho.  Las seales de que el beb se ha prendido eficazmente al pezn son:  Payton Doughty o succiona sin dolor.  Se escucha que traga [employer].  No hace ruidos ni chasquidos.  Hay movimientos musculares por arriba y por delante de sus odos al Printmaker.  El beb debe succionar unos 2-3 minutos para que salga la [place]. Permita que el nio se alimente en cada mama todo lo que desee. Alimente al beb hasta que se desprenda o se quede dormido en Freight forwarder y luego ofrzcale el segundo pecho.  Las seales de que el beb est lleno y satisfecho son:  Disminuye gradualmente el nmero de succiones o no succiona.  Se queda  dormido.  Extiende o relaja su cuerpo.  Retiene una pequea cantidad de [facility] boca.  Se desprende del pecho por s mismo.  Los signos de una lactancia materna eficaz son:  Los senos han aumentado la firmeza, el peso y el tamao antes de la alimentacin.  Son ms blandos despus de amamantar.  Un aumento del volumen de [place], y tambin el cambio de su consistencia y color se producen hacia el quinto da de Tour manager.  La congestin mamaria se [place] al dar de [place].  Los pezones no duelen, ni estn agrietados ni sangran.  De ser necesario, interrumpa la succin poniendo su dedo  en la esquina de la boca del beb y deslizando el dedo entre sus encas. A continuacin, retire la mama de su boca.  Es comn que los bebs regurgiten un poco despus de comer.  A menudo los bebs tragan aire al alimentarse. Esto puede hacer que se sienta molesto. Hacer eructar al beb al [place] de pecho puede ser de [place].  Se recomiendan suplementos de vitamina D para los bebs que reciben slo [address].  Evite el uso del chupete durante las primeras 4 a 6 semanas de vida.  Evite la alimentacin suplementaria con agua, frmula o jugo en lugar de la [employer]. La leche materna es todo el alimento que el beb necesita. No es necesario que el nio ingiera agua o preparados de bibern. Sus pechos producirn ms leche si se evita la alimentacin suplementaria durante las primeras semanas. COMO SABER SI EL BEB OBTIENE LA SUFICIENTE LECHE MATERNA  Preguntarse si el beb obtiene la cantidad suficiente de [place] es una preocupacin frecuente [employer]. Puede asegurarse que el beb tiene la leche suficiente si:   El beb succiona activamente y usted escucha que traga.   El beb parece estar relajado y satisfecho despus de Psychologist, clinical.   El nio se alimenta al menos 8 a 12 veces en 24 horas.  Durante los primeros 3 a 5 das de vida:  Moja 3-5 paales en 24 horas. La materia fecal  debe ser blanda y [place].  Tiene al menos 3 a 4 deposiciones en 24 horas. La materia fecal debe ser blanda y [place].  A los 5-7 das de vida, el beb debe tener al menos 3-6 deposiciones en 24 horas. La materia fecal debe ser grumosa y [place] a los 5 [address] de [place].  Su beb tiene una prdida de Psychologist, counselling a 7al 10% durante los primeros 3 [address]  [address] de [address]  El beb no pierde peso despus de 3-7 [address] de [address]  El beb debe aumentar 4 a 6 libras (120 a 170 gr.) por semana despus de los 4 [address] de vida.  Aumenta de [place] a los [address] de vida y vuelve al peso del nacimiento dentro de las 2 semanas. CONGESTIN MAMARIA  Durante la primera semana despus del [place], usted puede experimentar hinchazn en las mamas (congestin [place]). Al estar congestionadas, las mamas se sienten pesadas, calientes o sensibles al tacto. El pico de la congestin ocurre a las 24 -48 horas despus del parto.   La congestin puede disminuirse:  Continuando con la Tour manager.  Aumentando la frecuencia.  Tomando duchas calientes o aplicando calor hmedo en los senos antes de cada comida. Esto aumenta la circulacin y [place] a que la [place].   Masajeando suavemente el pecho antes y [place] [employer]. Con las yemas de los dedos, masajee la pared del pecho hacia el pezn en un movimiento circular.   Asegurarse de que el beb vaca al menos uno de sus pechos en cada alimentacin. Tambin ayuda si comienza la siguiente toma en el otro seno.   Extraiga manualmente o con un sacaleches las mamas para vaciar los pechos si el beb tiene sueo o no se aliment bien. Tambin puede extraer la [employer] cuando vuelva a trabajar o si siente que se estn congestionando las [place].  Asegrese de que el beb se prende y est bien colocado durante la Market researcher. Si sigue estas indicaciones, la congestin debe mejorar en 24 a 48 horas. Si an tiene dificultades, consulte a Barista.  CUDESE USTED MISMA    Cuide sus mamas.   Bese o dchese diariamente.   Evite usar [employer].   Use un sostn de soporte Evite el uso de sostenes con aro.  Seque al aire sus pezones durante 3-4 minutos despus de cada comida.   Utilice slo apsitos de algodn en el sostn para absorber las prdidas de [place]. La prdida de un poco de [employer] las comidas es normal.   Use solamente lanolina pura en sus pezones despus de Museum/gallery exhibitions officer. Usted no tiene que lavarla antes de alimentar al beb. Otra opcin es sacarse unas gotas de [place] y [employer] pezones.  Continuar con los autocontroles de la mama. Cudese.   Consuma alimentos saludables. Alterne 3 comidas con 3 colaciones.  Evite los alimentos que usted nota que perjudican al beb.  Dixie Dials, jugos de fruta y agua para Patent examiner su sed (aproximadamente 8 vasos al Futures trader).   Descanse con frecuencia, reljese y tome sus vitaminas prenatales para evitar la fatiga, el estrs y la anemia.  Evite masticar y fumar tabaco.  Evite el consumo de alcohol y drogas.  Tome medicamentos de venta libre y recetados tal como le indic su mdico o Social research officer, government. Siempre debe consultar con su mdico o farmacutico antes de tomar cualquier medicamento, vitamina o suplemento de hierbas.  Sepa que durante la lactancia puede quedar embarazada. Si lo desea, hable con su mdico acerca de la planificacin familiar y los mtodos anticonceptivos seguros que puede utilizar durante la Market researcher. SOLICITE ATENCIN MDICA SI:   Usted siente que quiere dejar de Museum/gallery exhibitions officer o se siente frustrada con la lactancia.  Siente dolor en los senos o en los pezones.  Sus pezones estn agrietados o Water quality scientist.  Sus pechos estn irritados, sensibles o calientes.  Tiene un rea hinchada en cualquiera de los senos.  Siente escalofros o fiebre.  Tiene nuseas o vmitos.  Observa un drenaje en los pezones.  Sus mamas no se llenan antes de Marine scientist al  5to da despus del [place].  Se siente triste y deprimida.  El nio est demasiado somnoliento como para comer.  El nio tiene problemas para Industrial/product designer.   Moja menos de 3 paales en 24 horas.  Mueve el intestino menos de 3 veces en 24 horas.  La piel del beb o la parte blanca de sus ojos est ms amarilla.   El beb no ha aumentado de [place] a los [address] de [place]. ASEGRESE DE QUE:   Comprende estas instrucciones.  Controlar su enfermedad.  Solicitar ayuda de inmediato si no mejora o si empeora. Document Released: 04/21/2005 Document Revised: 01/14/2012 [facility] Patient Information 2014 [place], [place].

## 2013-04-10 NOTE — Discharge Summary (Signed)
Obstetric Discharge Summary Reason for Admission: induction of labor d/t postdates Prenatal Procedures: ultrasound Intrapartum Procedures: cesarean: low cervical, transverse Postpartum Procedures: none Complications-Operative and Postpartum: none Eating, drinking, voiding, ambulating well.  +flatus.  Lochia and pain wnl.  Denies dizziness, lightheadedness, or sob. No complaints.  Desires early d/c  Hemoglobin  Date Value Range Status  04/09/2013 8.5* 12.0 - 15.0 g/dL Final     HCT  Date Value Range Status  04/09/2013 25.1* 36.0 - 46.0 % Final   Hospital Course:  Emily Andrade [id number] admitted at 41.4d for IOL d/t postdates. SVE upon arrival ft/th/-2, she received cytotec for cervical ripening, followed by cervical foley bulb placement, AROM, and pitocin was eventually initiated. She progressed to 8-9 w/ fat anterior lip/90/0, had no further cervical change in >15hrs. Never achieved adequate uc's via IUPC despite being on 39mu/min pitocin. Fetus was felt to be asynclitic. She developed chorioamnionitis and was treated. Decision made for PLTCS d/t arrest of dilation. Her postpartum course was uneventful.   Physical Exam:  General: alert, cooperative and no distress Lochia: appropriate Uterine Fundus: firm Incision: no significant drainage, bulky dressing still in place, to remove in shower and let nurse assess DVT Evaluation: No evidence of DVT seen on physical exam. Negative Homan's sign. No cords or calf tenderness. No significant calf/ankle edema.  Discharge Diagnoses: Term Pregnancy-delivered, PLTCS for failed IOL/arrest of dilation  Discharge Information: Date: 04/10/2013 Activity: pelvic rest Diet: routine Medications: PNV, Ibuprofen and Percocet Condition: stable Instructions: refer to practice specific booklet Discharge to: home Follow-up Information   Schedule an appointment as soon as possible for a visit with [facility] [place]. (4-6 weeks for your  postpartum visit)    Contact information:   [address] [place] [address] [phone]      Newborn Data: Live born female  Birth Weight: 9 lb 8.4 oz (4320 g) APGAR: 6, 9  Home with mother. Breast/bottlefeeding, discussed contraception options- wants to use condoms for now. Declines circumcision.  Marge Duncans 04/10/2013, 7:02 AM

## 2013-04-11 ENCOUNTER — Encounter (HOSPITAL_COMMUNITY): Payer: Self-pay | Admitting: Family Medicine

## 2013-04-11 MED ORDER — OXYCODONE-ACETAMINOPHEN 5-325 MG PO TABS: 1.0000 | ORAL_TABLET | ORAL | Status: DC | PRN

## 2013-04-11 NOTE — Lactation Note (Signed)
This note was copied from the chart of Emily Kasie Martinez-Garcia. Lactation Consultation Note: Follow up visit with mom with Eda interpreting. Mom reports that baby is nursing better- is doing better on left breast and she was able to nurse on the left breast without nipple shield this morning. Nursing well on the right breast. No questions at present. To call prn  Patient Name: Emily Andrade [id number] Date: 04/11/2013 Reason for consult: Follow-up assessment   Maternal Data    Feeding   LATCH Score/Interventions                      Lactation Tools Discussed/Used     Consult Status Consult Status: Complete    Pamelia Hoit 04/11/2013, 10:05 AM

## 2013-04-11 NOTE — Progress Notes (Signed)
UR chart review completed.  

## 2014-03-06 ENCOUNTER — Encounter (HOSPITAL_COMMUNITY): Payer: Self-pay | Admitting: Family Medicine

## 2015-11-03 ENCOUNTER — Ambulatory Visit (INDEPENDENT_AMBULATORY_CARE_PROVIDER_SITE_OTHER): Payer: Worker's Compensation | Admitting: Physician Assistant

## 2015-11-03 ENCOUNTER — Ambulatory Visit: Payer: Worker's Compensation

## 2015-11-03 VITALS — BP 120/80 | HR 72 | Temp 98.2°F | Resp 18 | Ht 70.0 in | Wt 149.5 lb

## 2015-11-03 DIAGNOSIS — R0781 Pleurodynia: Secondary | ICD-10-CM | POA: Diagnosis not present

## 2015-11-03 DIAGNOSIS — M549 Dorsalgia, unspecified: Secondary | ICD-10-CM | POA: Diagnosis not present

## 2015-11-03 DIAGNOSIS — W11XXXA Fall on and from ladder, initial encounter: Secondary | ICD-10-CM | POA: Diagnosis not present

## 2015-11-03 LAB — POCT CBC
Granulocyte percent: 49.7 %G (ref 37–80)
HCT, POC: 33.3 % — AB (ref 37.7–47.9)
Hemoglobin: 11.3 g/dL — AB (ref 12.2–16.2)
Lymph, poc: 2.5 (ref 0.6–3.4)
MCH, POC: 26.3 pg — AB (ref 27–31.2)
MCHC: 33.9 g/dL (ref 31.8–35.4)
MCV: 77.5 fL — AB (ref 80–97)
MID (CBC): 0.4 (ref 0–0.9)
MPV: 8.4 fL (ref 0–99.8)
POC Granulocyte: 2.9 (ref 2–6.9)
POC LYMPH %: 43.1 % (ref 10–50)
POC MID %: 7.2 %M (ref 0–12)
Platelet Count, POC: 217 10*3/uL (ref 142–424)
RBC: 4.3 M/uL (ref 4.04–5.48)
RDW, POC: 15.5 %
WBC: 5.8 10*3/uL (ref 4.6–10.2)

## 2015-11-03 LAB — POCT URINALYSIS DIP (MANUAL ENTRY)
Bilirubin, UA: NEGATIVE
GLUCOSE UA: NEGATIVE
Ketones, POC UA: NEGATIVE
NITRITE UA: NEGATIVE
Protein Ur, POC: NEGATIVE
Spec Grav, UA: 1.01
UROBILINOGEN UA: 0.2
pH, UA: 6

## 2015-11-03 LAB — POCT URINE PREGNANCY: PREG TEST UR: NEGATIVE

## 2015-11-03 MED ORDER — TRAMADOL HCL 50 MG PO TABS: 50.0000 mg | ORAL_TABLET | Freq: Three times a day (TID) | ORAL | Status: DC | PRN

## 2015-11-03 MED ORDER — CYCLOBENZAPRINE HCL 10 MG PO TABS: 10.0000 mg | ORAL_TABLET | Freq: Three times a day (TID) | ORAL | Status: DC | PRN

## 2015-11-03 NOTE — Patient Instructions (Addendum)
IF you received an x-ray today, you will receive an invoice from [facility]. Please contact [facility] at [phone] with questions or concerns regarding your invoice.   IF you received labwork today, you will receive an invoice from [facility]. Please contact [facility] at [phone] with questions or concerns regarding your invoice.   Our billing staff will not be able to assist you with questions regarding bills from these companies.  You will be contacted with the lab results as soon as they are available. The fastest way to get your results is to activate your My Chart account. Instructions are located on the last page of this paperwork. If you have not heard from us regarding the results in 2 weeks, please contact this office.    Por favor regrese a la clnica el lunes para volver a Nurse, adultver Estirar la espalda tres veces al C.H. Robinson Worldwideda.  Hielo de la espalda tres veces.  Hielo directamente despus  al da durante 15 minutos. Si siente mareos, nuseas, problemas para respirar, Geographical information systems officerorinar sangre o Bank [employer] [place] Companysangre en las heces, debe regresar inmediatamente o ir a la sala de Sports administratoremergencias.     Contusin costal (Rib Contusion) La contusin costal es un hematoma profundo en la zona de las costillas. Las contusiones son el resultado de un traumatismo cerrado que causa hemorragia y lesin en los tejidos subcutneos. La contusin costal puede incluir hematomas en las costillas, en la piel y los msculos de la zona. La The St. Paul Travelerspiel sobre la contusin puede tornarse de color Dibollazul, morado o Hamiltonamarillo. Las lesiones menores causarn contusiones sin Engineer, miningdolor, Biomedical engineerpero las ms graves pueden presentar dolor e inflamacin durante un par de semanas. CAUSAS  Generalmente, una contusin se debe a un golpe, un traumatismo o una fuerza directa en una zona del cuerpo. Esto suele ocurrir Engineer, manufacturing systemsmientras se practican deportes de contacto. SNTOMAS  Hinchazn y enrojecimiento en la zona de la  lesin.  Cambio de color de la zona lesionada.  Dolor con la palpacin y sensibilidad en la zona de la lesin.  Dolor con o sin Conservation officer, historic buildingsel movimiento. DIAGNSTICO  Se puede establecer un diagnstico al hacer una historia clnica y un examen fsico. Nicanor Bakeal vez sea necesario hacer una radiografa, una tomografa computarizada (TC) o una resonancia magntica (RM) para determinar si hay lesiones asociadas, como fracturas seas o lesiones internas. TRATAMIENTO  A menudo, el mejor tratamiento para una contusin costal es el reposo. La aplicacin de hielo o compresas fras en la zona de la lesin puede ayudar a reducir la hinchazn y la inflamacin. Tal vez le recomienden que haga ejercicios de respiracin profunda para reducir el riesgo de colapso pulmonar parcial y neumona. Para Engineer, materialsaliviar el dolor, tambin pueden recomendarle que tome medicamentos de venta libre o recetados. INSTRUCCIONES PARA EL CUIDADO EN EL HOGAR   Aplique hielo sobre la zona lesionada:  Ponga el hielo en una bolsa plstica.  Coloque una toalla entre la piel y la bolsa de hielo.  Coloque el hielo durante 20minutos, 2 a 3veces por Futures traderda.  Tome los medicamentos solamente como se lo haya indicado el mdico.  Mantenga la zona de la lesin en reposo. No realice actividades extenuantes ni actividades o movimientos que le causen Merck & Codolor. Sea cuidadoso al Education officer, environmentalrealizar actividades y evite golpearse la zona lesionada.  Practique ejercicios de respiracin profunda como se lo haya indicado el mdico.  No levante ningn objeto que pese ms de 5libras (2,3kg) hasta que el mdico lo autorice.  No consuma ningn producto que contenga  tabaco, lo que incluye cigarrillos, tabaco de Theatre managermascar o cigarrillos electrnicos. Si necesita ayuda para dejar de fumar, consulte al mdico. SOLICITE ATENCIN MDICA SI:   El hematoma o la hinchazn aumentan.  Tiene dolor que no se Technical breweralivia con el tratamiento.  Tiene fiebre. SOLICITE ATENCIN MDICA DE INMEDIATO SI:    Tiene dificultad para respirar o Company secretaryle falta el aire.  Le aparece una tos permanente o expectora esputo espeso o con sangre.  Tiene Programme researcher, broadcasting/film/videomalestar estomacal (nuseas), vomita o tiene dolor abdominal.   Esta informacin no tiene Theme park managercomo fin reemplazar el consejo del mdico. Asegrese de hacerle al mdico cualquier pregunta que tenga.

## 2015-11-03 NOTE — Progress Notes (Signed)
Emily Andrade 1981/02/14 35 y.o.   Chief Complaint  Patient presents with  . Work Related Injury    Emily Andrade from ladder at work yesterday & c/o right side & back pain     Date of Injury: 11/02/2015  History of Present Illness:  Presents for evaluation of work-related complaint.  Yesterday, she went to take something down at work.  She was getting off the ladder and slipped off, falling about  3 feet.  She was in the freezer.  Right side of back has pain that has progressively worsened.  Can not sit long due to the pain.  Pain on back, hip and stomach.  No loc.  She was in her 3rd day of menses.  She felt like balls of blood falling out of her vagina.  This has not continued.  No hematuria, or blood in stool.  No weakness, or incontinence.     ROS ROS otherwise unremarkable unless listed above.   No Known Allergies   Current medications reviewed and updated. Past medical history, family history, social history have been reviewed and updated.   Physical Exam  Constitutional: She is well-developed, well-nourished, and in no distress. No distress.  HENT:  Head: Normocephalic and atraumatic.  Eyes: Conjunctivae are normal. Pupils are equal, round, and reactive to light.  Cardiovascular: Normal rate and regular rhythm.  Exam reveals no gallop and no friction rub.   No murmur heard. Pulmonary/Chest: Effort normal and breath sounds normal. No respiratory distress. She has no wheezes.  Musculoskeletal:  Tenderness along the right posterior side anterior of  Rib border without ecchymosis or abrasion..    Skin: She is not diaphoretic.   Results for orders placed or performed in visit on 11/03/15  POCT CBC  Result Value Ref Range   WBC 5.8 4.6 - 10.2 K/uL   Lymph, poc 2.5 0.6 - 3.4   POC LYMPH PERCENT 43.1 10 - 50 %L   MID (cbc) 0.4 0 - 0.9   POC MID % 7.2 0 - 12 %M   POC Granulocyte 2.9 2 - 6.9   Granulocyte percent 49.7 37 - 80 %G   RBC 4.30 4.04 - 5.48 M/uL   Hemoglobin  11.3 (A) 12.2 - 16.2 g/dL   HCT, POC 16.133.3 (A) 09.637.7 - 47.9 %   MCV 77.5 (A) 80 - 97 fL   MCH, POC 26.3 (A) 27 - 31.2 pg   MCHC 33.9 31.8 - 35.4 g/dL   RDW, POC 04.515.5 %   Platelet Count, POC 217 142 - 424 K/uL   MPV 8.4 0 - 99.8 fL  POCT urinalysis dipstick  Result Value Ref Range   Color, UA yellow yellow   Clarity, UA clear clear   Glucose, UA negative negative   Bilirubin, UA negative negative   Ketones, POC UA negative negative   Spec Grav, UA 1.010    Blood, UA moderate (A) negative   pH, UA 6.0    Protein Ur, POC negative negative   Urobilinogen, UA 0.2    Nitrite, UA Negative Negative   Leukocytes, UA Trace (A) Negative  POCT urine pregnancy  Result Value Ref Range   Preg Test, Ur Negative Negative   Dg Ribs Unilateral W/chest Right  Result Date: 11/03/2015 CLINICAL DATA:  Emily Andrade from ladder.  Right rib pain EXAM: RIGHT RIBS AND CHEST - 3+ VIEW COMPARISON:  None. FINDINGS: No fracture or other bone lesions are seen involving the ribs. There is no evidence of pneumothorax or pleural effusion.  Both lungs are clear. Heart size and mediastinal contours are within normal limits. IMPRESSION: Negative. Electronically Signed   By: Marlan Palauharles  Clark M.D.   On: 11/03/2015 15:47   Dg Lumbar Spine Complete  Result Date: 11/03/2015 CLINICAL DATA:  Fall at work.  Back pain EXAM: LUMBAR SPINE - COMPLETE 4+ VIEW COMPARISON:  None. FINDINGS: There is no evidence of lumbar spine fracture. Alignment is normal. Intervertebral disc spaces are maintained. IMPRESSION: Negative. Electronically Signed   By: Marlan Palauharles  Clark M.D.   On: 11/03/2015 15:46   Dg Sacrum/coccyx  Result Date: 11/03/2015 CLINICAL DATA:  Emily Andrade from ladder.  Back pain EXAM: SACRUM AND COCCYX - 2+ VIEW COMPARISON:  None. FINDINGS: There is no evidence of fracture or other focal bone lesions. IMPRESSION: Negative. Electronically Signed   By: Marlan Palauharles  Clark M.D.   On: 11/03/2015 15:48      Assessment and Plan: Advised use of muscle  relaxer  Heat and ice regimen.   Given ultram today.   She will rtc in 5 days Letter given. Back pain, unspecified location - Plan: POCT CBC, POCT urinalysis dipstick, DG Lumbar Spine Complete, DG Sacrum/Coccyx, cyclobenzaprine (FLEXERIL) 10 MG tablet, traMADol (ULTRAM) 50 MG tablet  Fall from ladder, initial encounter - Plan: POCT CBC, POCT urinalysis dipstick, POCT urine pregnancy, cyclobenzaprine (FLEXERIL) 10 MG tablet, traMADol (ULTRAM) 50 MG tablet  Rib pain - Plan: DG Ribs Unilateral W/Chest Right, cyclobenzaprine (FLEXERIL) 10 MG tablet, traMADol (ULTRAM) 50 MG tablet  Emily PlattStephanie Zayed Griffie, PA-C [facility] 7/23/20179:13 AM

## 2015-11-03 NOTE — Progress Notes (Deleted)
[facility] [address], [place] [id number] [phone]  Date:  11/03/2015   Name:  Emily Andrade   DOB:  1980/10/22   MRN:  [id number]  PCP:  No PCP Per Patient    History of Present Illness:  Emily Andrade is a 35 y.o. female patient who [facility]     Patient Active Problem List   Diagnosis Date Noted  . Postpartum care following cesarean delivery 04/08/2013  . Post-dates pregnancy 04/05/2013    Past Medical History  Diagnosis Date  . Frequent UTI   . Depression   . Anemia   . Asthma     childhood    Past Surgical History  Procedure Laterality Date  . No past surgeries    . Cesarean section N/A 04/08/2013    Procedure: CESAREAN SECTION;  Surgeon: Reva Boresanya S Pratt, MD;  Location: [facility];  Service: Obstetrics;  Laterality: N/A;  . Small intestine surgery      Social History  Substance Use Topics  . Smoking status: Never Smoker   . Smokeless tobacco: Never Used  . Alcohol Use: No    Family History  Problem Relation Age of Onset  . Heart disease Father     MI  . Diabetes Father   . Birth defects Other     Downs syndrome, cleft lip and palate    No Known Allergies  Medication list has been reviewed and updated.  Current Outpatient Prescriptions on File Prior to Visit  Medication Sig Dispense Refill  . ibuprofen (ADVIL,MOTRIN) 600 MG tablet Take 1 tablet (600 mg total) by mouth every 6 (six) hours as needed for mild pain, moderate pain or cramping. 30 tablet 0  . oxyCODONE-acetaminophen (PERCOCET/ROXICET) 5-325 MG per tablet Take 1-2 tablets by mouth every 4 (four) hours as needed for severe pain (moderate - severe pain). (Patient not taking: Reported on 11/03/2015) 30 tablet 0  . Prenatal Vit-Fe Fumarate-FA (MULTIVITAMIN-PRENATAL) 27-0.8 MG TABS tablet Take 1 tablet by mouth at bedtime. Reported on 11/03/2015     No current facility-administered medications on file prior to visit.    ROS   Physical Examination: BP  120/80 mmHg  Pulse 72  Temp(Src) 98.2 F (36.8 C) (Oral)  Resp 18  Ht 5\' 10"  (1.778 m)  Wt 149 lb 8 oz (67.813 kg)  BMI 21.45 kg/m2  SpO2 98%  LMP 10/31/2015 (Exact Date)  Breastfeeding? No Ideal Body Weight: Weight in (lb) to have BMI = 25: 173.9  Physical Exam   Assessment and Plan: Emily Andrade is a 35 y.o. female who is here today  There are no diagnoses linked to this encounter.  Trena PlattStephanie Jeyden Coffelt, PA-C [facility] 11/03/2015 2:31 PM

## 2015-11-05 ENCOUNTER — Telehealth: Payer: Self-pay

## 2015-11-05 ENCOUNTER — Ambulatory Visit (INDEPENDENT_AMBULATORY_CARE_PROVIDER_SITE_OTHER): Payer: Worker's Compensation | Admitting: Physician Assistant

## 2015-11-05 VITALS — BP 118/80 | HR 71 | Temp 98.0°F | Resp 18 | Ht 70.0 in | Wt 150.0 lb

## 2015-11-05 DIAGNOSIS — R0781 Pleurodynia: Secondary | ICD-10-CM

## 2015-11-05 DIAGNOSIS — W11XXXA Fall on and from ladder, initial encounter: Secondary | ICD-10-CM

## 2015-11-05 DIAGNOSIS — M549 Dorsalgia, unspecified: Secondary | ICD-10-CM | POA: Diagnosis not present

## 2015-11-05 DIAGNOSIS — M5489 Other dorsalgia: Secondary | ICD-10-CM

## 2015-11-05 LAB — POCT CBC
Granulocyte percent: 37.4 %G (ref 37–80)
HCT, POC: 34.6 % — AB (ref 37.7–47.9)
Hemoglobin: 11.7 g/dL — AB (ref 12.2–16.2)
LYMPH, POC: 3.4 (ref 0.6–3.4)
MCH: 26 pg — AB (ref 27–31.2)
MCHC: 33.8 g/dL (ref 31.8–35.4)
MCV: 76.7 fL — AB (ref 80–97)
MID (cbc): 0.5 (ref 0–0.9)
MPV: 8.8 fL (ref 0–99.8)
PLATELET COUNT, POC: 224 10*3/uL (ref 142–424)
POC Granulocyte: 2.3 (ref 2–6.9)
POC LYMPH PERCENT: 54.5 %L — AB (ref 10–50)
POC MID %: 8.1 %M (ref 0–12)
RBC: 4.51 M/uL (ref 4.04–5.48)
RDW, POC: 15 %
WBC: 6.2 10*3/uL (ref 4.6–10.2)

## 2015-11-05 MED ORDER — IBUPROFEN 800 MG PO TABS: 800.0000 mg | ORAL_TABLET | Freq: Three times a day (TID) | ORAL | Status: DC | PRN

## 2015-11-05 NOTE — Telephone Encounter (Signed)
Called prescription - Tramadol in to pharmacy.

## 2015-11-05 NOTE — Patient Instructions (Addendum)
     IF you received an x-ray today, you will receive an invoice from [facility]. Please contact [facility] at [phone] with questions or concerns regarding your invoice.   IF you received labwork today, you will receive an invoice from [facility]. Please contact [facility] at [phone] with questions or concerns regarding your invoice.   Our billing staff will not be able to assist you with questions regarding bills from these companies.  You will be contacted with the lab results as soon as they are available. The fastest way to get your results is to activate your My Chart account. Instructions are located on the last page of this paperwork. If you have not heard from us regarding the results in 2 weeks, please contact this office.    Por favor regrese a la clnica el lunes para volver a Nurse, adultver Estirar la espalda tres veces al C.H. Robinson Worldwideda. Hielo de la espalda tres veces. Hielo directamente despus al da durante 15 minutos. Si siente mareos, nuseas, problemas para respirar, Geographical information systems officerorinar sangre o Bank [employer] [place] Companysangre en las heces, debe regresar inmediatamente o ir a la sala de Sports administratoremergencias.

## 2015-11-11 NOTE — Progress Notes (Signed)
Emily Andrade 1980-08-21 35 y.o.   Chief Complaint  Patient presents with  . Follow-up    w/c back injury      Date of Injury: 11/02/2015  History of Present Illness:  Presents for evaluation of work-related complaint.  Patient returns for follow-up of back pain.  Patient reports that this is doing much better. She still has some pain in her lower back on the right side however this is resolving. She still has difficulty with lifting objects.  ROS ROS otherwise unremarkable unless listed above.   No Known Allergies   Current medications reviewed and updated. Past medical history, family history, social history have been reviewed and updated.   Physical Exam  Constitutional: She is oriented to person, place, and time and well-developed, well-nourished, and in no distress. No distress.  Eyes: Pupils are equal, round, and reactive to light. Right eye exhibits no discharge. Left eye exhibits no discharge.  Cardiovascular: Normal rate.  Exam reveals no gallop and no friction rub.   No murmur heard. Pulmonary/Chest: Effort normal and breath sounds normal. No respiratory distress. She has no wheezes.  Musculoskeletal: Normal range of motion. She exhibits no edema.  No spinous tenderness throughout. There is no erythema or swelling. Forward flexion limited.  Lateral deviation normal, normal torso rotation.  Neurological: She is alert and oriented to person, place, and time.  Skin: Skin is warm and dry. She is not diaphoretic.  Psychiatric: Mood and affect normal.     Assessment and Plan: 35 year old female is here today with back pain. -Given stretches and ice. She will return in 5 days. -A restrictions given. Right-sided back pain, unspecified location - Plan: POCT CBC, ibuprofen (ADVIL,MOTRIN) 800 MG tablet  Fall from ladder, initial encounter  Back pain, unspecified location  Rib pain  Trena PlattStephanie Ignatius Kloos, PA-C [facility] 7/13/20177:42 AM

## 2016-09-18 ENCOUNTER — Encounter: Payer: Self-pay | Admitting: Pediatric Intensive Care

## 2016-10-14 NOTE — Congregational Nurse Program (Signed)
Congregational Nurse Program Note  Date of Encounter: 09/18/2016  Past Medical History: Past Medical History:  Diagnosis Date  . Anemia   . Asthma    childhood  . Depression   . Frequent UTI     Encounter Details:     CNP Questionnaire - 09/18/16 1500      Patient Demographics   Is this a new or existing patient? New   Patient is considered a/an Immigrant   Race Latino/Hispanic     Patient Assistance   Location of Patient Assistance [place]   Patient's financial/insurance status Self-Pay (Uninsured)   Uninsured Patient (Orange Research officer, trade unionCard/Care Connects) Yes   Interventions Counseled to make appt. with provider   Patient referred to apply for the following financial assistance [employer] Card/Care Connects   Food insecurities addressed Not Applicable   Transportation assistance No   Assistance securing medications No   Educational health offerings Acute disease;Navigating the healthcare system     Encounter Details   Primary purpose of visit Acute Illness/Condition Visit;Education/Health Concerns   Was an Emergency Department visit averted? Not Applicable   Does patient have a medical provider? Yes   Patient referred to Clinic   Was a mental health screening completed? (GAINS tool) No   Does patient have dental issues? No   Does patient have vision issues? No   Does your patient have an abnormal blood pressure today? No   Since previous encounter, have you referred patient for abnormal blood pressure that resulted in a new diagnosis or medication change? No   Does your patient have an abnormal blood glucose today? No   Since previous encounter, have you referred patient for abnormal blood glucose that resulted in a new diagnosis or medication change? No   Was there a life-saving intervention made? No    Via interpreter Alcario Droughtrica- client states that she has had vaginal bleeding since her LMP as well as left flank and abdomen pain that is somewhat worse than normal menstrual pain.  Client does not have a PCP but [facility] and has appointment in 2 weeks. CN advises discusses bleeding with [facility] provider but to go to ED is pain or bleeding worsens before appointment.

## 2017-09-17 ENCOUNTER — Inpatient Hospital Stay (HOSPITAL_COMMUNITY): Payer: Self-pay

## 2017-09-17 ENCOUNTER — Encounter (HOSPITAL_COMMUNITY): Payer: Self-pay | Admitting: *Deleted

## 2017-09-17 ENCOUNTER — Inpatient Hospital Stay (HOSPITAL_COMMUNITY)
Admission: AD | Admit: 2017-09-17 | Discharge: 2017-09-17 | Disposition: A | Discharge status: Home / Self Care | Payer: Self-pay | Source: Ambulatory Visit | Attending: Obstetrics and Gynecology | Admitting: Obstetrics and Gynecology

## 2017-09-17 DIAGNOSIS — Z79899 Other long term (current) drug therapy: Secondary | ICD-10-CM | POA: Insufficient documentation

## 2017-09-17 DIAGNOSIS — R1030 Lower abdominal pain, unspecified: Secondary | ICD-10-CM | POA: Insufficient documentation

## 2017-09-17 DIAGNOSIS — O26899 Other specified pregnancy related conditions, unspecified trimester: Secondary | ICD-10-CM

## 2017-09-17 DIAGNOSIS — O99341 Other mental disorders complicating pregnancy, first trimester: Secondary | ICD-10-CM | POA: Insufficient documentation

## 2017-09-17 DIAGNOSIS — R109 Unspecified abdominal pain: Secondary | ICD-10-CM

## 2017-09-17 DIAGNOSIS — O209 Hemorrhage in early pregnancy, unspecified: Secondary | ICD-10-CM | POA: Insufficient documentation

## 2017-09-17 DIAGNOSIS — Z87440 Personal history of urinary (tract) infections: Secondary | ICD-10-CM | POA: Insufficient documentation

## 2017-09-17 DIAGNOSIS — Z3A08 8 weeks gestation of pregnancy: Secondary | ICD-10-CM | POA: Insufficient documentation

## 2017-09-17 DIAGNOSIS — F329 Major depressive disorder, single episode, unspecified: Secondary | ICD-10-CM | POA: Insufficient documentation

## 2017-09-17 LAB — URINALYSIS, ROUTINE W REFLEX MICROSCOPIC
Bilirubin Urine: NEGATIVE
Glucose, UA: NEGATIVE mg/dL
Hgb urine dipstick: NEGATIVE
Ketones, ur: NEGATIVE mg/dL
LEUKOCYTES UA: NEGATIVE
Nitrite: NEGATIVE
PROTEIN: NEGATIVE mg/dL
Specific Gravity, Urine: 1.005 (ref 1.005–1.030)
pH: 6 (ref 5.0–8.0)

## 2017-09-17 LAB — WET PREP, GENITAL
Clue Cells Wet Prep HPF POC: NONE SEEN
Sperm: NONE SEEN
Trich, Wet Prep: NONE SEEN
WBC, Wet Prep HPF POC: NONE SEEN
YEAST WET PREP: NONE SEEN

## 2017-09-17 LAB — CBC
HEMATOCRIT: 37.7 % (ref 36.0–46.0)
HEMOGLOBIN: 12.5 g/dL (ref 12.0–15.0)
MCH: 27.5 pg (ref 26.0–34.0)
MCHC: 33.2 g/dL (ref 30.0–36.0)
MCV: 83 fL (ref 78.0–100.0)
PLATELETS: 260 10*3/uL (ref 150–400)
RBC: 4.54 MIL/uL (ref 3.87–5.11)
RDW: 14.5 % (ref 11.5–15.5)
WBC: 8.6 10*3/uL (ref 4.0–10.5)

## 2017-09-17 LAB — POCT PREGNANCY, URINE: Preg Test, Ur: POSITIVE — AB

## 2017-09-17 LAB — HCG, QUANTITATIVE, PREGNANCY: HCG, BETA CHAIN, QUANT, S: 139994 m[IU]/mL — AB (ref <5)

## 2017-09-17 NOTE — MAU Note (Signed)
Pt reports she started having abd  And back cramping and saw   Some blood in toilet today.

## 2017-09-17 NOTE — Discharge Instructions (Signed)
Dolor abdominal durante el embarazo (Abdominal Pain During Pregnancy) El dolor de vientre (abdominal) es habitual durante el embarazo. Generalmente no se trata de un problema grave. Otras veces puede ser un signo de que algo no anda bien. Siempre comunquese con su mdico si tiene dolor abdominal. CUIDADOS EN EL HOGAR Controle el dolor para ver si hay cambios. Las indicaciones que siguen pueden ayudarla a sentirse mejor:  Notenga sexo (relaciones sexuales) ni se coloque nada dentro de la vagina hasta que se sienta mejor.  Haga reposo hasta que el dolor se calme.  Si siente ganas de vomitar (nuseas ) beba lquidos claros. No consuma alimentos slidos hasta que se sienta mejor.  Slo tome los medicamentos que le haya indicado su mdico.  Cumpla con las visitas al mdico segn las indicaciones. SOLICITE AYUDA DE INMEDIATO SI:  Tiene un sangrado, pierde lquido o elimina trozos de tejido por la vagina.  Siente ms dolor o clicos.  Comienza a vomitar.  Siente dolor al orinar u observa sangre en la orina.  Tiene fiebre.  No siente que el beb se mueva mucho.  Se siente muy dbil o cree que va a desmayarse.  Tiene dificultad para respirar con o sin dolor en el vientre.  Siente un dolor de cabeza muy intenso y dolor en el vientre.  Observa que sale un lquido por la vagina y tiene dolor abdominal.  La materia fecal es lquida (diarrea).  El dolor en el viente no desaparece, o empeora, luego de hacer reposo. ASEGRESE DE QUE:  Comprende estas instrucciones.  Controlar su afeccin.  Recibir ayuda de inmediato si no mejora o si empeora. Esta informacin no tiene como fin reemplazar el consejo del mdico. Asegrese de hacerle al mdico cualquier pregunta que tenga. Document Released: 01/01/2011 Document Revised: 08/13/2015 Document Reviewed: 11/18/2012 Elsevier Interactive Patient Education  2018 Elsevier Inc.  

## 2017-09-17 NOTE — MAU Provider Note (Signed)
History     CSN: [id number]  Arrival date and time: 09/17/17 1308   First Provider Initiated Contact with Patient 09/17/17 1511      Chief Complaint  Patient presents with  . Vaginal Bleeding   HPI    [name] is a 37 y.o. female G2P1001 @ [redacted]w[redacted]d here in MAU with vaginal bleeding that started today when she woke up this morning. Says she saw little drops of blood in the toilet. Says at the same time she noticed pain in the lower abdomen and lower back. The blood she noted was a light blood color. No pain currently. Has not taken any medications over the counter.   OB History    Gravida  2   Para  1   Term  1   Preterm  0   AB  0   Living  1     SAB  0   TAB  0   Ectopic  0   Multiple  0   Live Births  1           Past Medical History:  Diagnosis Date  . Anemia   . Asthma    childhood  . Depression   . Frequent UTI     Past Surgical History:  Procedure Laterality Date  . CESAREAN SECTION N/A 04/08/2013   Procedure: CESAREAN SECTION;  Surgeon: Reva Bores, MD;  Location: [facility];  Service: Obstetrics;  Laterality: N/A;  . SMALL INTESTINE SURGERY      Family History  Problem Relation Age of Onset  . Heart disease Father        MI  . Diabetes Father   . Birth defects Other        Downs syndrome, cleft lip and palate    Social History   Tobacco Use  . Smoking status: Never Smoker  . Smokeless tobacco: Never Used  Substance Use Topics  . Alcohol use: No    Alcohol/week: 0.0 oz  . Drug use: No    Allergies: No Known Allergies  Medications Prior to Admission  Medication Sig Dispense Refill Last Dose  . cyclobenzaprine (FLEXERIL) 10 MG tablet Take 1 tablet (10 mg total) by mouth 3 (three) times daily as needed for muscle spasms. 30 tablet 0 Taking  . ibuprofen (ADVIL,MOTRIN) 800 MG tablet Take 1 tablet (800 mg total) by mouth every 8 (eight) hours as needed. 30 tablet 0   . oxyCODONE-acetaminophen (PERCOCET/ROXICET) 5-325  MG per tablet Take 1-2 tablets by mouth every 4 (four) hours as needed for severe pain (moderate - severe pain). (Patient not taking: Reported on 11/03/2015) 30 tablet 0 Not Taking  . traMADol (ULTRAM) 50 MG tablet Take 1 tablet (50 mg total) by mouth every 8 (eight) hours as needed. (Patient not taking: Reported on 11/05/2015) 15 tablet 0 Not Taking   Results for orders placed or performed during the hospital encounter of 09/17/17 (from the past 48 hour(s))  Urinalysis, Routine w reflex microscopic     Status: Abnormal   Collection Time: 09/17/17  1:45 PM  Result Value Ref Range   Color, Urine STRAW (A) YELLOW   APPearance CLEAR CLEAR   Specific Gravity, Urine 1.005 1.005 - 1.030   pH 6.0 5.0 - 8.0   Glucose, UA NEGATIVE NEGATIVE mg/dL   Hgb urine dipstick NEGATIVE NEGATIVE   Bilirubin Urine NEGATIVE NEGATIVE   Ketones, ur NEGATIVE NEGATIVE mg/dL   Protein, ur NEGATIVE NEGATIVE mg/dL   Nitrite NEGATIVE NEGATIVE  Leukocytes, UA NEGATIVE NEGATIVE    Comment: Performed at [facility], [address] [place], [place] [address]  Pregnancy, urine POC     Status: Abnormal   Collection Time: 09/17/17  2:17 PM  Result Value Ref Range   Preg Test, Ur POSITIVE (A) NEGATIVE    Comment:        THE SENSITIVITY OF THIS METHODOLOGY IS >24 mIU/mL   CBC     Status: None   Collection Time: 09/17/17  2:47 PM  Result Value Ref Range   WBC 8.6 4.0 - 10.5 K/uL   RBC 4.54 3.87 - 5.11 MIL/uL   Hemoglobin 12.5 12.0 - 15.0 g/dL   HCT 60.4 54.0 - 98.1 %   MCV 83.0 78.0 - 100.0 fL   MCH 27.5 26.0 - 34.0 pg   MCHC 33.2 30.0 - 36.0 g/dL   RDW 19.1 47.8 - 29.5 %   Platelets 260 150 - 400 K/uL    Comment: Performed at [facility], [address] [place], [place] [address]  hCG, quantitative, pregnancy     Status: Abnormal   Collection Time: 09/17/17  2:47 PM  Result Value Ref Range   hCG, Beta Chain, Quant, S 139,994 (H) <5 mIU/mL    Comment:          GEST. AGE      CONC.  (mIU/mL)   <=1 WEEK         5 - 50     2 WEEKS       50 - 500     3 WEEKS       100 - 10,000     4 WEEKS     1,000 - 30,000     5 WEEKS     3,500 - 115,000   6-8 WEEKS     12,000 - 270,000    12 WEEKS     15,000 - 220,000        FEMALE AND NON-PREGNANT FEMALE:     LESS THAN 5 mIU/mL Performed at [facility], [address] [place], [place] [address]   Wet prep, genital     Status: None   Collection Time: 09/17/17  3:28 PM  Result Value Ref Range   Yeast Wet Prep HPF POC NONE SEEN NONE SEEN   Trich, Wet Prep NONE SEEN NONE SEEN   Clue Cells Wet Prep HPF POC NONE SEEN NONE SEEN   WBC, Wet Prep HPF POC NONE SEEN NONE SEEN   Sperm NONE SEEN     Comment: Performed at [facility], [address] [place], [place] [address]   US Ob Less Than 14 Weeks With Ob Transvaginal  Result Date: 09/17/2017 CLINICAL DATA:  Bleeding and abdominal pain today. Quantitative beta HCG [id number]. Bleeding and abdominal pain today. LMP 07/21/2017. By LMP patient is 8 weeks 2 days. EDC by LMP is 04/27/2018. EXAM: OBSTETRIC <14 WK [place] AND TRANSVAGINAL OB US TECHNIQUE: Both transabdominal and transvaginal ultrasound examinations were performed for complete evaluation of the gestation as well as the maternal uterus, adnexal regions, and pelvic cul-de-sac. Transvaginal technique was performed to assess early pregnancy. COMPARISON:  None applicable FINDINGS: Intrauterine gestational sac: Single Yolk sac:  Visualized. Embryo:  Visualized. Cardiac Activity: Visualized. Heart Rate: 173 bpm CRL:  19.57 mm   8 w   3 d                  [place] EDC: 04/26/2018 Subchorionic hemorrhage:  None Maternal uterus/adnexae: Normal appearance  of both ovaries. LEFT corpus luteum cyst is present. No free pelvic fluid. IMPRESSION: 1. Single living intrauterine embryo. 2. Ultrasound confirms clinical EDC of 04/27/2018. 3. No significant subchorionic hemorrhage. Electronically Signed   By: Norva Pavlov M.D.   On: 09/17/2017 16:18    Review of Systems   Constitutional: Negative for fever.  Gastrointestinal: Positive for abdominal pain.  Genitourinary: Positive for vaginal bleeding. Negative for dysuria.  Musculoskeletal: Positive for back pain.   Physical Exam   Blood pressure 113/72, pulse 73, temperature 98.9 F (37.2 C), resp. rate 18, height 5' 1.5" (1.562 m), weight 153 lb (69.4 kg), last menstrual period 07/21/2017.  Physical Exam  Constitutional: She is oriented to person, place, and time. Vital signs are normal. She appears well-developed and well-nourished.  Non-toxic appearance. She does not have a sickly appearance. She does not appear ill. No distress.  GI: Soft. Normal appearance. There is tenderness in the right lower quadrant and suprapubic area. There is no rigidity, no rebound, no guarding and no CVA tenderness.  Genitourinary:  Genitourinary Comments: Bimanual exam: Cervix closed, posterior  Uterus non tender, enlarged  Adnexa non tender, no masses bilaterally GC/Chlam, wet prep done Scant amount of brown discharge noted on exam glove. Chaperone present for exam.   Musculoskeletal: Normal range of motion.  Neurological: She is alert and oriented to person, place, and time.  Skin: Skin is warm. She is not diaphoretic.  Psychiatric: Her behavior is normal.   MAU Course  Procedures  None  MDM  Wet prep & GC HIV, CBC, Hcg, ABO US OB transvaginal  O positive blood type   Assessment and Plan   A:  1. [redacted] weeks gestation of pregnancy   2. Bleeding in early pregnancy   3. Abdominal pain affecting pregnancy     P:  Discharge home in stable condition Pelvic rest Bleeding precautions Start prenatal care/ keep your appointment   Rasch, Harolyn Rutherford, NP 09/17/2017 5:00 PM

## 2017-09-18 LAB — GC/CHLAMYDIA PROBE AMP (~~LOC~~) NOT AT ARMC
Chlamydia: NEGATIVE
NEISSERIA GONORRHEA: NEGATIVE

## 2017-09-24 ENCOUNTER — Ambulatory Visit (INDEPENDENT_AMBULATORY_CARE_PROVIDER_SITE_OTHER): Payer: Self-pay | Admitting: Family Medicine

## 2017-09-24 ENCOUNTER — Encounter: Payer: Self-pay | Admitting: Family Medicine

## 2017-09-24 VITALS — BP 109/68 | HR 80 | Ht 60.0 in | Wt 153.0 lb

## 2017-09-24 DIAGNOSIS — O09291 Supervision of pregnancy with other poor reproductive or obstetric history, first trimester: Secondary | ICD-10-CM

## 2017-09-24 DIAGNOSIS — J4520 Mild intermittent asthma, uncomplicated: Secondary | ICD-10-CM

## 2017-09-24 DIAGNOSIS — Z3480 Encounter for supervision of other normal pregnancy, unspecified trimester: Secondary | ICD-10-CM

## 2017-09-24 DIAGNOSIS — F329 Major depressive disorder, single episode, unspecified: Secondary | ICD-10-CM | POA: Insufficient documentation

## 2017-09-24 DIAGNOSIS — J45909 Unspecified asthma, uncomplicated: Secondary | ICD-10-CM | POA: Insufficient documentation

## 2017-09-24 DIAGNOSIS — O34219 Maternal care for unspecified type scar from previous cesarean delivery: Secondary | ICD-10-CM

## 2017-09-24 DIAGNOSIS — Z113 Encounter for screening for infections with a predominantly sexual mode of transmission: Secondary | ICD-10-CM

## 2017-09-24 DIAGNOSIS — O09521 Supervision of elderly multigravida, first trimester: Secondary | ICD-10-CM

## 2017-09-24 DIAGNOSIS — O09299 Supervision of pregnancy with other poor reproductive or obstetric history, unspecified trimester: Secondary | ICD-10-CM

## 2017-09-24 DIAGNOSIS — O09529 Supervision of elderly multigravida, unspecified trimester: Secondary | ICD-10-CM | POA: Insufficient documentation

## 2017-09-24 DIAGNOSIS — F32A Depression, unspecified: Secondary | ICD-10-CM | POA: Insufficient documentation

## 2017-09-24 DIAGNOSIS — O0990 Supervision of high risk pregnancy, unspecified, unspecified trimester: Secondary | ICD-10-CM | POA: Insufficient documentation

## 2017-09-24 DIAGNOSIS — Z3481 Encounter for supervision of other normal pregnancy, first trimester: Secondary | ICD-10-CM

## 2017-09-24 DIAGNOSIS — F3342 Major depressive disorder, recurrent, in full remission: Secondary | ICD-10-CM

## 2017-09-24 NOTE — Addendum Note (Signed)
Addended by: Arne Cleveland on: 09/24/2017 11:41 AM   Modules accepted: Orders

## 2017-09-24 NOTE — Patient Instructions (Signed)
Primer trimestre de Psychiatristembarazo First Trimester of Pregnancy El primer trimestre de Psychiatristembarazo se extiende desde la semana1 hasta el final de la semana13 (mes1 al mes3). Una semana despus de que un espermatozoide fecunda un vulo, este se implantar en la pared uterina. Este embrin comenzar a Camera operatordesarrollarse hasta convertirse en un beb. Sus genes y los de su pareja forman el beb. Los genes del varn determinan si ser un nio o una nia. Entre la semana6 y [place]8, se forman los ojos y Recruitment consultantel rostro, y los latidos del corazn pueden verse en una ecografa. Al final de las 12semanas, todos los rganos del beb estn formados. Ahora que est embarazada, querr hacer todo lo que est a su alcance para tener un beb sano. Dos de las cosas ms importantes son Wilburt Finlaytener un buen cuidado prenatal y seguir las indicaciones del mdico. El cuidado prenatal incluye toda la asistencia mdica que usted recibe antes del nacimiento del beb. Esto ayudar a Radio producerprevenir, Engineer, manufacturingdetectar y tratar cualquier problema durante el embarazo y [place] Parkel parto. Durante Financial risk analystel primer trimestre, ocurren cambios en el cuerpo. Su organismo atraviesa por muchos cambios durante el Winchesterembarazo. Estos cambios varan de [place] mujer a [place]otra.  Al principio, puede aumentar o bajar algunos kilos.  Puede tener Programme researcher, broadcasting/film/videomalestar estomacal (nuseas) y vomitar. Si no puede controlar los vmitos, llame al mdico.  Puede cansarse con facilidad.  Es posible que tenga dolores de cabeza que pueden aliviarse con ciertos medicamentos. Todos los United Parcelmedicamentos que tome deben estar aprobados por el mdico.  Puede orinar con mayor frecuencia. El dolor al orinar puede significar que usted tiene una infeccin de la vejiga.  Debido al Vanetta Muldersembarazo, puede tener acidez estomacal.  Puede estar estreida, ya que ciertas hormonas enlentecen los movimientos de los msculos que empujan las heces a travs del intestino.  Pueden aparecer hemorroides o hincharse las venas (venas varicosas).  Las ConAgra Foodsmamas  pueden empezar a Government social research officeragrandarse y Emergency planning/management officerestar sensibles. Los pezones pueden sobresalir ms, y el tejido que los rodea (arola) tornarse ms oscuro.  Las Veterinary surgeonencas pueden sangrar y estar sensibles al cepillado y al hilo dental.  Pueden aparecer zonas oscuras o manchas (cloasma, mscara del [place]embarazo) en el rostro. Esto probablemente se atenuar despus del nacimiento del beb.  Los perodos menstruales se interrumpirn.  Tal vez no tenga apetito.  Puede sentir un fuerte deseo de consumir ciertos alimentos.  Puede tener cambios a Theatre managernivel emocional da a da, por ejemplo, por momentos puede estar emocionada por el Psychiatristembarazo y por otros preocuparse porque algo pueda salir mal con el embarazo o el beb.  Tendr sueos ms vvidos y extraos.  Tal vez haya cambios en el cabello. Esto cambios pueden incluir su engrosamiento, crecimiento rpido y Allied Waste Industriescambios en la textura. Adems, a algunas mujeres se les cae el cabello durante o despus del embarazo, o tienen el cabello seco o fino. Lo ms probable es que el cabello se le normalice despus del nacimiento del beb.  Qu debe esperar en las visitas prenatales Durante una visita prenatal de rutina:  La pesarn para asegurarse de que usted y el beb estn creciendo normalmente.  Le tomarn la presin arterial.  Le medirn el abdomen para controlar el desarrollo del beb.  Se escucharn los latidos cardacos fetales entre las semanas10 y14 de Saltilloembarazo.  Se analizarn los resultados de los estudios solicitados en visitas anteriores.  El mdico puede preguntarle lo siguiente:  Cmo se siente.  Si siente los movimientos del beb.  Si ha tenido sntomas anormales, como prdida  de lquido, sangrado, dolores de cabeza intensos o clicos abdominales.  Si est consumiendo algn producto que contenga tabaco, como cigarrillos, tabaco de Theatre manager y Administrator, Civil Service.  Si tiene [employer].  Otros estudios que pueden realizarse durante el primer trimestre  incluyen lo siguiente:  Anlisis de sangre para determinar el grupo sanguneo y Engineer, manufacturing la presencia de infecciones previas. Las pruebas tambin se utilizarn para Chief Strategy Officer si tiene bajo nivel de hierro (anemia) y protenas en los glbulos rojos (anticuerpos Rh). En funcin de sus factores de riesgo, o si ya tuvo diabetes durante un embarazo anterior, le pueden hacer pruebas para determinar si tiene un nivel alto de azcar en la sangre, algo que puede afectar a embarazadas (diabetes gestacional).  Anlisis de orina para detectar infecciones, diabetes o protenas en la orina.  Una ecografa para confirmar que el beb crece y se desarrolla correctamente.  Estudios fetales para Engineer, manufacturing problemas de la mdula espinal (espina bfida) y sndrome de Down.  Prueba del VIH (virus de inmunodeficiencia humana). Los exmenes prenatales de rutina incluyen la prueba de deteccin del VIH, a menos que decida no Futures trader.  Es posible que necesite otras pruebas adicionales.  Siga estas instrucciones en su casa: Medicamentos  Siga las indicaciones del mdico en relacin con el uso de medicamentos. Durante el embarazo, hay medicamentos que pueden tomarse y otros que no.  Tome vitaminas prenatales que contengan por lo menos (?g) de cido flico.  Si est estreida, tome un laxante suave, si el mdico lo autoriza. Comida y bebida  Esperanza Richters dieta equilibrada que incluya gran cantidad de frutas y verduras frescas, cereales integrales, buenas fuentes de protenas como carnes [place], huevos o tofu, y lcteos descremados. El mdico la ayudar a Production assistant, radio cantidad de peso que puede [place].  No coma carne cruda ni quesos sin cocinar. Estos elementos contienen grmenes que pueden causar defectos congnitos en el beb.  La ingesta diaria de cuatro o cinco comidas pequeas en lugar de tres comidas abundantes puede ayudar a Yahoo nuseas y los vmitos. Si empieza a tener nuseas,  comer algunas [address] puede ser de [place]. Beber lquidos Altria Group, Teacher, English as a foreign language de tomarlos durante las comidas, tambin puede ayudar a Paramedic las nuseas y los vmitos.  Limite el consumo de alimentos con alto contenido de grasas y azcares procesados, como alimentos fritos o dulces.  Para evitar el estreimiento: ? Consuma alimentos ricos en fibra, como frutas y verduras frescas, cereales integrales y frijoles. ? Beba suficiente lquido para mantener la orina clara o de color amarillo plido. Actividad  Haga ejercicio solamente como se lo haya indicado el mdico. La mayora de las mujeres pueden continuar su rutina de ejercicios durante el [place]. Intente realizar como mnimo de actividad fsica por lo menos 5das a la semana. El ejercicio la ayudar a: ? Engineering geologist. ? Mantenerse en forma. ? Estar preparada para el trabajo de parto y [place].  Los dolores, los clicos en la parte baja del abdomen o los calambres en la cintura son un buen indicio de que debe dejar de Corporate treasurer. Consulte al mdico antes de seguir haciendo ejercicios con normalidad.  Intente no estar de pie FedEx. Mueva las piernas con frecuencia si debe estar de pie en un lugar durante mucho tiempo.  Evite levantar pesos Fortune Brands.  Use zapatos de tacones bajos y [place].  Puede seguir teniendo [employer] [place], salvo que el mdico le indique lo contrario. Alivio del  dolor y del Financial trader un sostn que le brinde buen soporte para Engineer, materials de [place].  Dese baos de asiento con agua tibia para Engineer, materials o las molestias causadas por las hemorroides. Use una crema para las hemorroides si el mdico la autoriza.  Descanse con las piernas elevadas si tiene calambres o dolor de cintura.  Si tiene venas varicosas en las piernas, use medias de descanso. Eleve los pies durante , 3 o 4veces por da. Limite el consumo de  sal en su dieta. Cuidados prenatales  Programe las visitas prenatales para la semana12 de [place]. Generalmente se programan cada mes al principio y se hacen ms frecuentes en los 2 ltimos meses antes del parto.  Escriba sus preguntas. Llvelas cuando concurra a las visitas prenatales.  Concurra a todas las visitas prenatales tal como se lo haya indicado el mdico. Esto es importante. Seguridad  Use el cinturn de seguridad en todo momento mientras conduce.  Haga una lista de los nmeros de telfono de Associate Professor, que [employer] nmeros de telfono de familiares, [place], el hospital y los departamentos de polica y bomberos. Instrucciones generales  Pdale al mdico que la derive a clases de educacin prenatal en su localidad. Debe comenzar a tomar las clases antes de que empiece el mes6 de [place].  Pida ayuda si tiene necesidades nutricionales o de asesoramiento Academic librarian. El mdico puede aconsejarla o derivarla a especialistas para que la ayuden con diferentes necesidades.  No se d baos de inmersin en agua caliente, baos turcos ni saunas.  No se haga duchas vaginales ni use tampones o toallas higinicas perfumadas.  No mantenga las piernas cruzadas durante [place].  Evite el contacto con las bandejas sanitarias de los gatos y la tierra que estos animales usan. Estos elementos contienen bacterias que pueden causar defectos congnitos al beb y la posible prdida del feto debido a un aborto espontneo o muerte fetal.  No fume, no consuma hierbas ni medicamentos que no hayan sido recetados por el mdico. Las sustancias qumicas que estos productos contienen afectan la formacin y el desarrollo del beb.  No consuma ningn producto que contenga nicotina o tabaco, como cigarrillos y Administrator, Civil Service. Si necesita ayuda para dejar de fumar, consulte al American Express. Puede recibir asesoramiento y otro tipo de recursos para dejar de fumar.  Programe una cita con el  dentista. En su casa, lvese los dientes con un cepillo dental blando y psese el hilo dental con suavidad. Comunquese con un mdico si:  Tiene mareos.  Siente clicos leves, presin en la pelvis o dolor persistente en el abdomen.  Tiene nuseas, vmitos o diarrea persistentes.  Brett Fairy secrecin vaginal con mal olor.  Siente dolor al ConocoPhillips.  Observa ms hinchazn en la cara, las manos, las piernas o los tobillos.  Est expuesta a la quinta enfermedad o a la varicela.  Est expuesta al sarampin alemn (rubola) y nunca lo haba tenido. Solicite ayuda de inmediato si:  Tiene fiebre.  Tiene una prdida de lquido por la vagina.  Tiene sangrado o pequeas prdidas vaginales.  Siente dolor intenso o clicos en el abdomen.  Sube o baja de peso rpidamente.  Vomita sangre de color rojo brillante o una sustancia similar a los granos de caf.  Dolor de cabeza intenso.  Le falta el aire.  Sufre cualquier tipo de traumatismo, por ejemplo, debido a una cada o un accidente automovilstico. Resumen  El primer trimestre de Psychiatrist se extiende desde la semana1  hasta el final de la semana13 (mes1 al mes3).  Su organismo atraviesa por muchos cambios durante el [place]. Estos cambios varan de [place] a [place].  Tendr visitas prenatales de rutina. Durante esas visitas, el mdico la examinar, hablar con usted acerca de los resultados de sus pruebas y Network engineer cmo se siente. Esta informacin no tiene Theme park manager el consejo del mdico. Asegrese de hacerle al mdico cualquier pregunta que tenga. Document Released: 01/29/2005 Document Revised: 07/25/2016 Document Reviewed: 07/25/2016 Elsevier Interactive Patient Education  [employer].

## 2017-09-24 NOTE — Progress Notes (Signed)
   Subjective:    Emily Andrade is a G2P1001 [redacted]w[redacted]d being seen today for her first obstetrical visit.  Her obstetrical history is significant for advanced maternal age and previous C-section. Patient does intend to breast feed. Pregnancy history fully reviewed.  Patient reports nausea.  Vitals:   09/24/17 1036  BP: 109/68  Pulse: 80  Weight: 153 lb (69.4 kg)    HISTORY: OB History  Gravida Para Term Preterm AB Living  0 0 1  SAB TAB Ectopic Multiple Live Births  0 0 0 0 1    # Outcome Date GA Lbr Len/2nd Weight Sex Delivery Anes PTL Lv  2 Current           1 Term 04/08/13 [redacted]w[redacted]d  9 lb 8.4 oz (4.32 kg) M CS-LTranv EPI  LIV   Past Medical History:  Diagnosis Date  . Anemia   . Asthma    childhood  . Depression   . Frequent UTI    Past Surgical History:  Procedure Laterality Date  . CESAREAN SECTION N/A 04/08/2013   Procedure: CESAREAN SECTION;  Surgeon: Reva Bores, MD;  Location: [facility];  Service: Obstetrics;  Laterality: N/A;   Family History  Problem Relation Age of Onset  . Heart disease Father        MI  . Diabetes Father   . Birth defects Other        Downs syndrome, cleft lip and palate     Exam    Uterus:     Pelvic Exam:    Perineum: Normal Perineum   Vulva: Bartholin's, Urethra, Skene's normal   Vagina:  normal mucosa, normal discharge  System: Breast:  normal appearance, no masses or tenderness   Skin: normal coloration and turgor, no rashes    Neurologic: normal   Extremities: normal strength, tone, and muscle mass   HEENT extra ocular movement intact and sclera clear, anicteric   Mouth/Teeth mucous membranes moist, pharynx normal without lesions and dental hygiene good   Neck supple   Cardiovascular: regular rate and rhythm, no murmurs or gallops   Respiratory:  appears well, vitals normal, no respiratory distress, acyanotic, normal RR, ear and throat exam is normal, neck free of mass or lymphadenopathy, chest clear, no wheezing,  crepitations, rhonchi, normal symmetric air entry   Abdomen: soft, non-tender; bowel sounds normal; no masses,  no organomegaly      Assessment/Plan:    Pregnancy: G2P1001 1. Supervision of other normal pregnancy, antepartum New OB labs - Obstetric Panel, Including HIV - GC/Chlamydia probe amp ([place])not at [facility] - Culture, OB Urine - Cytology - PAP - Genetic Screening  2. Multigravida of advanced maternal age in first trimester Desires NIPS - will obtain at next visit  3. Previous cesarean delivery affecting pregnancy, antepartum Desires TOLAC if possible  4. Recurrent major depressive disorder, in full remission (HCC)   5. Mild intermittent asthma without complication Never has used an inhaler  6. H/O macrosomia in infant in prior pregnancy, currently pregnant - Hemoglobin A1c  Return in 1 month (on 10/22/2017).  Reva Bores 09/24/2017

## 2017-09-24 NOTE — Progress Notes (Signed)
DATING AND VIABILITY SONOGRAM   Emily Andrade is a 37 y.o. year old G2P1001 with LMP Patient's last menstrual period was 07/21/2017. which would correlate to  [redacted]w[redacted]d weeks gestation.  She has regular menstrual cycles.   She is here today for a confirmatory initial sonogram.    GESTATION: [place]     FETAL ACTIVITY:          Heart rate         178bpm          The fetus is active.   GESTATIONAL AGE AND  BIOMETRICS:  Gestational criteria: Estimated Date of Delivery: 04/27/18 by LMP now at [redacted]w[redacted]d  Previous Scans:0  CROWN RUMP LENGTH 2.3cm 9.0wks                 AVERAGE EGA(BY THIS SCAN):  9.0 weeks  WORKING EDD( LMP ):  04/27/18

## 2017-09-25 LAB — GC/CHLAMYDIA PROBE AMP (~~LOC~~) NOT AT ARMC
Chlamydia: NEGATIVE
Neisseria Gonorrhea: NEGATIVE

## 2017-09-25 LAB — OBSTETRIC PANEL, INCLUDING HIV
Antibody Screen: NEGATIVE
Basophils Absolute: 0 10*3/uL (ref 0.0–0.2)
Basos: 0 %
EOS (ABSOLUTE): 0.1 10*3/uL (ref 0.0–0.4)
EOS: 1 %
HEMOGLOBIN: 11.5 g/dL (ref 11.1–15.9)
HEP B S AG: NEGATIVE
HIV SCREEN 4TH GENERATION: NONREACTIVE
Hematocrit: 34.7 % (ref 34.0–46.6)
Immature Grans (Abs): 0 10*3/uL (ref 0.0–0.1)
Immature Granulocytes: 0 %
LYMPHS ABS: 1.5 10*3/uL (ref 0.7–3.1)
Lymphs: 26 %
MCH: 26.5 pg — AB (ref 26.6–33.0)
MCHC: 33.1 g/dL (ref 31.5–35.7)
MCV: 80 fL (ref 79–97)
Monocytes Absolute: 0.3 10*3/uL (ref 0.1–0.9)
Monocytes: 6 %
Neutrophils Absolute: 3.9 10*3/uL (ref 1.4–7.0)
Neutrophils: 67 %
PLATELETS: 249 10*3/uL (ref 150–450)
RBC: 4.34 x10E6/uL (ref 3.77–5.28)
RDW: 15.1 % (ref 12.3–15.4)
RH TYPE: POSITIVE
RPR: NONREACTIVE
Rubella Antibodies, IGG: 1.03 index (ref >0.99)
WBC: 5.9 10*3/uL (ref 3.4–10.8)

## 2017-09-25 LAB — HEMOGLOBIN A1C
Est. average glucose Bld gHb Est-mCnc: 108 mg/dL
Hgb A1c MFr Bld: 5.4 % (ref 4.8–5.6)

## 2017-09-26 LAB — URINE CULTURE, OB REFLEX

## 2017-09-26 LAB — CULTURE, OB URINE

## 2017-10-12 ENCOUNTER — Encounter: Payer: Self-pay | Admitting: *Deleted

## 2017-10-12 ENCOUNTER — Ambulatory Visit (INDEPENDENT_AMBULATORY_CARE_PROVIDER_SITE_OTHER): Payer: Self-pay | Admitting: *Deleted

## 2017-10-12 ENCOUNTER — Encounter: Payer: Self-pay | Admitting: Family Medicine

## 2017-10-12 VITALS — BP 109/75 | HR 75

## 2017-10-12 VITALS — BP 109/75 | HR 75 | Wt 152.8 lb

## 2017-10-12 DIAGNOSIS — R300 Dysuria: Secondary | ICD-10-CM

## 2017-10-12 DIAGNOSIS — N898 Other specified noninflammatory disorders of vagina: Secondary | ICD-10-CM

## 2017-10-12 DIAGNOSIS — Z113 Encounter for screening for infections with a predominantly sexual mode of transmission: Secondary | ICD-10-CM

## 2017-10-12 LAB — POCT URINE QUALITATIVE DIPSTICK BLOOD: Blood, UA: POSITIVE

## 2017-10-12 NOTE — Progress Notes (Signed)
Patient seen and assessed by nursing staff.  Agree with documentation and plan.  

## 2017-10-12 NOTE — Progress Notes (Signed)
  Subjective:  Emily MerlesDina Martinez Andrade is a 37 y.o. G2P1001 at 2672w6d being seen today for prenatal care.  Patient reports nausea/ vomiting, yellow vaginal discharge for 3 week(s), urinary frequency, urgency and dysuria x 3 weeks, without flank pain, fever, chills. Denies leaking of fluid and vaginal bleeding.   The following portions of the patient's history were reviewed and updated as appropriate: allergies, current medications, past family history, past medical history, past social history, past surgical history and problem list.   Objective:   Vitals:   10/12/17 1348  BP: 109/75  Pulse: 75   She appears well, afebrile. Urine dipstick: positive for RBC's.    Assessment and Plan:  Pregnancy: G2P1001 at 5972w6d  1. Dysuria Urine dip positive for RBCs - will send culture - Culture, OB Urine  2. Vaginal discharge Self Swab ordered - Cervicovaginal ancillary only   PLAN:  GC, chlamydia, trichomonas, BVAG, CVAG probe sent to lab. Urine culture ordered Treatment: To be determined once lab results are received ROV as scheduled

## 2017-10-13 LAB — CERVICOVAGINAL ANCILLARY ONLY
Bacterial vaginitis: NEGATIVE
CANDIDA VAGINITIS: NEGATIVE
CHLAMYDIA, DNA PROBE: NEGATIVE
Neisseria Gonorrhea: NEGATIVE
Trichomonas: NEGATIVE

## 2017-10-13 NOTE — Progress Notes (Signed)
This encounter was created in error - please disregard.

## 2017-10-14 LAB — URINE CULTURE, OB REFLEX

## 2017-10-14 LAB — CULTURE, OB URINE

## 2017-10-19 ENCOUNTER — Encounter: Payer: Self-pay | Admitting: Radiology

## 2017-10-20 ENCOUNTER — Ambulatory Visit (INDEPENDENT_AMBULATORY_CARE_PROVIDER_SITE_OTHER): Payer: Self-pay | Admitting: Family Medicine

## 2017-10-20 ENCOUNTER — Encounter: Payer: Self-pay | Admitting: Family Medicine

## 2017-10-20 VITALS — BP 101/68 | HR 73 | Wt 153.0 lb

## 2017-10-20 DIAGNOSIS — O09521 Supervision of elderly multigravida, first trimester: Secondary | ICD-10-CM

## 2017-10-20 DIAGNOSIS — O34219 Maternal care for unspecified type scar from previous cesarean delivery: Secondary | ICD-10-CM

## 2017-10-20 DIAGNOSIS — Z3480 Encounter for supervision of other normal pregnancy, unspecified trimester: Secondary | ICD-10-CM

## 2017-10-20 DIAGNOSIS — O09522 Supervision of elderly multigravida, second trimester: Secondary | ICD-10-CM

## 2017-10-20 NOTE — Progress Notes (Signed)
    PRENATAL VISIT NOTE  Subjective:  Emily Andrade is a 37 y.o. G2P1001 at 10123w0d being seen today for ongoing prenatal care.  She is currently monitored for the following issues for this high-risk pregnancy and has Supervision of other normal pregnancy, antepartum; AMA (advanced maternal age) multigravida 35+; Previous cesarean delivery affecting pregnancy, antepartum; Depression; and Asthma on their problem list.  Patient reports bleeding just pink tinged discharge with some itching.  Contractions: Not present. Vag. Bleeding: None.  Movement: Absent. Denies leaking of fluid.   The following portions of the patient's history were reviewed and updated as appropriate: allergies, current medications, past family history, past medical history, past social history, past surgical history and problem list. Problem list updated.  Objective:   Vitals:   10/20/17 0854  BP: 101/68  Pulse: 73  Weight: 153 lb (69.4 kg)    Fetal Status: Fetal Heart Rate (bpm): 154 US   Movement: Absent     General:  Alert, oriented and cooperative. Patient is in no acute distress.  Skin: Skin is warm and dry. No rash noted.   Cardiovascular: Normal heart rate noted  Respiratory: Normal respiratory effort, no problems with respiration noted  Abdomen: Soft, gravid, appropriate for gestational age.  Pain/Pressure: Absent     Pelvic: Cervical exam deferred        Extremities: Normal range of motion.  Edema: None  Mental Status: Normal mood and affect. Normal behavior. Normal judgment and thought content.   Assessment and Plan:  Pregnancy: G2P1001 at 4723w0d  1. Supervision of other normal pregnancy, antepartum Nml New OB labs  2. Multigravida of advanced maternal age in first trimester NIPS today  3. Previous cesarean delivery affecting pregnancy, antepartum   General obstetric precautions including but not limited to vaginal bleeding, contractions, leaking of fluid and fetal movement were reviewed in  detail with the patient. Please refer to After Visit Summary for other counseling recommendations.  Return in 1 month (on 11/17/2017).  Future Appointments  Date Time Provider Department Center  11/17/2017  8:30 AM Everson BingPickens, Charlie, MD [facility]    Reva Boresanya S Argie Lober, MD

## 2017-10-20 NOTE — Progress Notes (Signed)
Had some light spotting 1 week ago but nothing since then

## 2017-10-20 NOTE — Patient Instructions (Signed)
Segundo trimestre de [employer] Trimester of Pregnancy El segundo trimestre va desde la semana14 hasta la 65, desde el cuarto hasta el sexto mes, y suele ser el momento en el que mejor se siente. Su organismo se ha adaptado a Public relations account executive, y comienza a Print production planner. En general, las nuseas matutinas han disminuido o han desaparecido completamente, puede tener ms energa y un aumento de apetito. El segundo trimestre es tambin la poca en la que el feto se desarrolla rpidamente. Hacia el final del sexto mes, el feto mide aproximadamente 9pulgadas (23cm) y pesa alrededor de 1 libras (700g). Es probable que sienta que el beb se Software engineer (da pataditas) entre las 71 y Slater. Cambios en el cuerpo durante el segundo trimestre Su cuerpo continua experimentando numerosos cambios durante su segundo trimestre. Estos cambios varan de [place] a [place].  Seguir American Family Insurance. Notar que la parte baja del abdomen sobresale.  Podrn aparecer las primeras Apache Corporation caderas, el abdomen y las [place].  Es posible que tenga dolores de cabeza que pueden aliviarse con ciertos medicamentos. Los medicamentos que tome deben estar aprobados por el mdico.  Tal vez tenga necesidad de orinar con ms frecuencia porque el feto est ejerciendo presin sobre la vejiga.  Debido al Glennis Brink podr sentir Victorio Palm estomacal con frecuencia.  Puede estar estreida, ya que ciertas hormonas enlentecen los movimientos de los msculos que JPMorgan Chase & Co desechos a travs de los intestinos.  Pueden aparecer hemorroides o abultarse e hincharse las venas (venas varicosas).  Puede sentir dolor en la espalda. Esto se debe a: ? Aumento de peso. ? Las hormonas del [place] articulaciones en la pelvis. ? Un cambio en el peso y los msculos que ayudan a Theatre manager su equilibrio.  Sus pechos seguirn creciendo y se pondrn cada vez ms sensibles.  Las Production manager y  estar sensibles al cepillado y al hilo dental.  Pueden aparecer zonas oscuras o manchas (cloasma, mscara del [place]) en el rostro. Esto probablemente se atenuar despus del nacimiento del beb.  Es posible que se forme una lnea oscura desde el ombligo hasta la zona del pubis (linea nigra). Esto probablemente se atenuar despus del nacimiento del beb.  Tal vez haya cambios en el cabello. Esto cambios pueden incluir su engrosamiento, crecimiento rpido y Harley-Davidson textura. Adems, a algunas mujeres se les cae el cabello durante o despus del embarazo, o tienen el cabello seco o fino. Lo ms probable es que el cabello se le normalice despus del nacimiento del beb.  Qu debe esperar en las visitas prenatales Durante una visita prenatal de rutina:  La pesarn para asegurarse de que usted y el feto estn creciendo normalmente.  Le tomarn la presin arterial.  Le medirn el abdomen para controlar el desarrollo del beb.  Se escucharn los latidos cardacos fetales.  Se evaluarn los resultados de los estudios solicitados en visitas anteriores.  El mdico puede preguntarle lo siguiente:  Cmo se siente.  Si siente los movimientos del beb.  Si ha tenido sntomas anormales, como prdida de lquido, [place], dolores de cabeza intensos o clicos abdominales.  Si est consumiendo algn producto que contenga tabaco, como cigarrillos, tabaco de Higher education careers adviser y Psychologist, sport and exercise.  Si tiene [place].  Otros estudios que podrn realizarse durante el segundo trimestre incluyen lo siguiente:  Anlisis de sangre para detectar lo siguiente: ? Concentraciones de hierro bajas (anemia). ? Nivel alto de azcar en la sangre que afecta a  las mujeres embarazadas (diabetes gestacional) entre las semanas 24 y 14. ? Anticuerpos Rh. Esto es para detectar una protena en los glbulos rojos (factor Rh).  Anlisis de orina para detectar infecciones, diabetes o protenas en la orina.  Una  ecografa para confirmar que el beb crece y se desarrolla correctamente.  Una amniocentesis para diagnosticar posibles problemas genticos.  Estudios del feto para descartar espina bfida y sndrome de [place].  Prueba del VIH (virus de inmunodeficiencia humana). Los exmenes prenatales de rutina incluyen la prueba de deteccin del VIH, a menos que decida no Radiation protection practitioner.  Siga estas indicaciones en su casa: [place] indicaciones del mdico en relacin con el uso de medicamentos. Durante el embarazo, hay medicamentos que pueden tomarse y 26 que no.  Tome vitaminas prenatales que contengan por lo menos [id number] (?g) de cido flico.  Si est estreida, tome un laxante suave, si el mdico lo autoriza. Qu debe comer y beber  Illene Bolus una dieta equilibrada que incluya gran cantidad de frutas y verduras frescas, cereales integrales, buenas fuentes de protenas como carnes [place], huevos o tofu, y lcteos descremados. El mdico la ayudar a Office manager cantidad de peso que puede Jo[place].  No coma carne cruda ni quesos sin cocinar. Estos elementos contienen grmenes que pueden causar defectos congnitos en el beb.  Si no consume muchos alimentos con calcio, hable con su mdico sobre si debera tomar un suplemento diario de calcio.  Limite el consumo de alimentos con alto contenido de grasas y azcares procesados, como alimentos fritos o dulces.  Para evitar el estreimiento: ? Bebe suficiente lquido para mantener la orina clara o de color amarillo plido. ? Consuma alimentos ricos en fibra, como frutas y verduras frescas, cereales integrales y frijoles. Actividad  Haga ejercicio solamente como se lo haya indicado el mdico. La mayora de las mujeres pueden continuar su rutina de ejercicios durante el [place]. Intente realizar como mnimo 34minutos de actividad fsica por lo menos 5das a la semana. Deje de hacer ejercicio si experimenta contracciones  uterinas.  No levante objetos pesados, use zapatos de tacones bajos y mantenga una buena postura.  Puede seguir American Electric Power, a menos que el mdico le indique lo contrario. Alivio del dolor y del Tree surgeon  Use un sostn que le brinde buen soporte para prevenir las molestias causadas por la sensibilidad en los pechos.  Dese baos de asiento con agua tibia para Best boy o las molestias causadas por las hemorroides. Use una crema para las hemorroides si el mdico la autoriza.  Descanse con las piernas elevadas si tiene calambres o dolor de cintura.  Si tiene venas varicosas, use medias de descanso. Eleve los pies durante 38minutos, 3 o 4veces por da. Limite el consumo de sal en su dieta. Cuidados prenatales  Escriba sus preguntas. Llvelas cuando concurra a las visitas prenatales.  Concurra a todas las visitas prenatales tal como se lo haya indicado el mdico. Esto es importante. Seguridad  Use el cinturn de seguridad en todo momento mientras conduce.  Haga una lista de los nmeros de telfono de Freight forwarder, que BJ's nmeros de telfono de familiares, [place], el hospital y los departamentos de polica y bomberos. Instrucciones generales  Pdale al mdico que la derive a clases de educacin prenatal en su localidad. Debe comenzar a tomar las clases antes de que empiece el mes6 de [place].  Pida ayuda si tiene necesidades nutricionales o de asesoramiento Solicitor. El mdico puede aconsejarla o derivarla a  especialistas para que la ayuden con diferentes necesidades.  No se d baos de inmersin en agua caliente, baos turcos ni saunas.  No se haga duchas vaginales ni use tampones o toallas higinicas perfumadas.  No mantenga las piernas cruzadas durante [place].  Evite el contacto con las bandejas sanitarias de los gatos y la tierra que estos animales usan. Estos elementos contienen bacterias que pueden causar defectos congnitos  al beb y la posible prdida del feto debido a un aborto espontneo o muerte fetal.  Evite fumar, consumir hierbas, beber alcohol y tomar frmacos que no le hayan recetado. Las sustancias qumicas que estos productos contienen pueden afectar la formacin y el desarrollo del beb.  No consuma ningn producto que contenga nicotina o tabaco, como cigarrillos y Administrator, Civil Service. Si necesita ayuda para dejar de fumar, consulte al American Express.  Visite a su dentista si an no lo ha Occupational hygienist. Use un cepillo de dientes blando para higienizarse los dientes y psese el hilo dental con suavidad. Comunquese con un mdico si:  Tiene mareos.  Siente clicos leves, presin en la pelvis o dolor persistente en el abdomen.  Tiene nuseas, vmitos o diarrea persistentes.  Brett Fairy secrecin vaginal con mal olor.  Siente dolor al ConocoPhillips. Solicite ayuda de inmediato si:  Tiene fiebre.  Tiene una prdida de lquido por la vagina.  Tiene sangrado o pequeas prdidas vaginales.  Siente dolor intenso o clicos en el abdomen.  Sube de peso o baja de peso rpidamente.  Tiene dificultad para respirar y siente dolor de pecho.  Sbitamente se le hinchan mucho el rostro, las [place], los tobillos, los pies o las piernas.  No ha sentido los movimientos del beb durante Georgianne Fick.  Siente un dolor de cabeza intenso que no se alivia al tomar United Parcel.  Nota cambios en la visin. Resumen  El segundo trimestre va desde la semana14 hasta la 27, desde el cuarto hasta el sexto mes. Es tambin una poca en la que el feto se desarrolla rpidamente.  Su organismo atraviesa por muchos cambios durante el [place]. Estos cambios varan de [place] a [place].  Evite fumar, consumir hierbas, beber alcohol y tomar frmacos que no le hayan recetado. Estas sustancias qumicas afectan la formacin y el desarrollo de su beb.  No consuma ningn producto que contenga tabaco, lo que incluye  cigarrillos, tabaco de Theatre manager y Administrator, Civil Service. Si necesita ayuda para dejar de fumar, consulte al mdico.  Comunquese con su mdico si tiene preguntas sobre esto. Concurra a todas las visitas prenatales tal como se lo haya indicado el mdico. Esto es importante. Esta informacin no tiene Theme park manager el consejo del mdico. Asegrese de hacerle al mdico cualquier pregunta que tenga. Document Released: 01/29/2005 Document Revised: 09/01/2016 Document Reviewed: 09/01/2016 Elsevier Interactive Patient Education  2018 Elsevier Inc.   [place] materna Breastfeeding Decidir amamantar es una de las mejores elecciones que puede hacer por usted y su beb. Un cambio en las hormonas durante el embarazo hace que las mamas produzcan leche materna en las glndulas productoras de [place]. Las hormonas impiden que la leche materna sea liberada antes del nacimiento del beb. Adems, impulsan el flujo de leche luego del nacimiento. Una vez que ha comenzado a Museum/gallery exhibitions officer, Conservation officer, nature beb, as Immunologist succin o Theatre manager, pueden estimular la liberacin de [place] de las glndulas productoras de [place]. Los beneficios de Smith International investigaciones demuestran que la lactancia materna ofrece muchos beneficios de salud para bebs y  madres. Adems, ofrece una forma gratuita y conveniente de Corporate treasurer al beb. Para el beb  La primera leche (calostro) ayuda a Careers information officer funcionamiento del aparato digestivo del beb.  Las clulas especiales de la leche (anticuerpos) ayudan a Artist las infecciones en el beb.  Los bebs que se alimentan con leche materna tambin tienen menos probabilidades de tener asma, alergias, obesidad o diabetes de tipo 2. Adems, tienen menor riesgo de sufrir el sndrome de muerte sbita del lactante (SMSL).  Los nutrientes de la [place] materna son mejores para Patent examiner las necesidades del beb en comparacin con la CHS Inc.  La leche materna mejora el desarrollo  cerebral del beb. Para usted  La lactancia materna favorece el desarrollo de un vnculo muy especial entre la madre y el beb.  Es conveniente. La leche materna es econmica y siempre est disponible a la Human resources officer.  La lactancia materna ayuda a quemar caloras. Claude Manges a perder el peso ganado durante el [place].  Hace que el tero vuelva al tamao que tena antes del embarazo ms rpido. Adems, disminuye el sangrado (loquios) despus del parto.  La lactancia materna contribuye a reducir Nurse, adult de tener diabetes de tipo 2, osteoporosis, artritis reumatoide, enfermedades cardiovasculares y cncer de mama, ovario, tero y endometrio en el futuro. Informacin bsica sobre la lactancia Comienzo de la lactancia  Encuentre un lugar cmodo para sentarse o Teacher, music, con un buen respaldo para el cuello y la espalda.  Coloque una almohada o una manta enrollada debajo del beb para acomodarlo a la altura de la mama (si est sentada). Las almohadas para Museum/gallery exhibitions officer se han diseado especialmente a fin de servir de apoyo para los brazos y el beb Smithfield Foods.  Asegrese de que la barriga del beb (abdomen) est frente a la suya.  Masajee suavemente la mama. Con las yemas de los dedos, Liberty Media bordes exteriores de la mama hacia adentro, en direccin al pezn. Esto estimula el flujo de [place]. Si la Home [employer]ot, es posible que deba Educational psychologist con este movimiento durante la Market researcher.  Sostenga la mama con 4 dedos por debajo y Multimedia programmer por arriba del pezn (forme la letra "C" con la mano). Asegrese de que los dedos se encuentren lejos del pezn y de la boca del beb.  Empuje suavemente los labios del beb con el pezn o con el dedo.  Cuando la boca del beb se abra lo suficiente, acrquelo rpidamente a la mama e introduzca todo el pezn y la arola, tanto como sea posible, dentro de la boca del beb. La arola es la zona de color que rodea al pezn. ? Debe haber ms  arola visible por arriba del labio superior del beb que por debajo del labio inferior. ? Los labios del beb deben estar abiertos y extendidos hacia afuera (evertidos) para asegurar que el beb se prenda de forma adecuada y cmoda. ? La lengua del beb debe estar entre la enca inferior y Educational psychologist.  Asegrese de que la boca del beb est en la posicin correcta alrededor del pezn (prendido). Los labios del beb deben crear un sello sobre la mama y estar doblados hacia afuera (invertidos).  Es comn que el beb succione durante 2 a 3 minutos para que comience el flujo de [place]. Cmo debe prenderse Es muy importante que le ensee al beb cmo prenderse adecuadamente a la mama. Si el beb no se prende adecuadamente, puede causar Federated Department Stores, reducir la produccin de [place]  materna y hacer que el beb tenga un escaso aumento de Zeiglerpeso. Adems, si el beb no se prende adecuadamente al pezn, puede tragar aire durante la alimentacin. Esto puede causarle molestias al beb. Hacer eructar al beb al Pilar Platecambiar de mama puede ayudarlo a liberar el aire. Sin embargo, ensearle al beb cmo prenderse a la mama adecuadamente es la mejor manera de evitar que se sienta molesto por tragar Oceanographeraire mientras se alimenta. Signos de que el beb se ha prendido adecuadamente al pezn  Tironea o succiona de modo silencioso, sin Publishing rights managercausarle dolor. Los labios del beb deben estar extendidos hacia afuera (evertidos).  Se escucha que traga cada 3 o 4 succiones una vez que la WPS Resourcesleche ha comenzado a Radiographer, therapeuticfluir (despus de que se produzca el reflejo de eyeccin de la [place] Brooklynleche).  Hay movimientos musculares por arriba y por delante de sus odos al Printmakersuccionar.  Signos de que el beb no se ha prendido Audiological scientistadecuadamente al pezn  Hace ruidos de succin o de chasquido mientras se Tree surgeonalimenta.  Siente dolor en los pezones.  Si cree que el beb no se prendi correctamente, deslice el dedo en la comisura de la boca y Ameren Corporationcolquelo entre las encas  del beb para interrumpir la succin. Intente volver a comenzar a Museum/gallery exhibitions officeramamantar. Signos de Market researcherlactancia materna exitosa Signos del beb  El beb disminuir gradualmente el nmero de succiones o dejar de succionar por completo.  El beb se quedar dormido.  El cuerpo del beb se relajar.  El beb retendr Neomia Dearuna pequea cantidad de Kindred Healthcareleche en la boca.  El beb se desprender solo del Glassportpecho.  Signos que presenta usted  Las mamas han aumentado la firmeza, el peso y el tamao 1 a 3 horas despus de Museum/gallery exhibitions officeramamantar.  Estn ms blandas inmediatamente despus de amamantar.  Se producen un aumento del volumen de Azerbaijanleche y un cambio en su consistencia y color hacia el quinto da de Market researcherlactancia.  Los pezones no duelen, no estn agrietados ni sangran.  Signos de que su beb recibe la cantidad de leche suficiente  Mojar por lo menos 1 o 2paales durante las primeras 24horas despus del nacimiento.  Mojar por lo menos 5 o 6paales cada 24horas durante la primera semana despus del nacimiento. La orina debe ser clara o de color amarillo plido a los 5das de vida.  Mojar entre 6 y 8paales cada 24horas a medida que el beb sigue creciendo y desarrollndose.  Defeca por lo menos 3 veces en 24 horas a los 5 [address]  [address] de [address]. Las heces deben ser blandas y Armed forces operational officeramarillentas.  Defeca por lo menos 3 veces en 24 horas a los [address] das de [address]. Las heces deben ser grumosas y Armed forces operational officeramarillentas.  No registra una prdida de peso mayor al 10% del peso al nacer durante los primeros 3 [address]  [address]das de [place]vida.  Aumenta de peso un promedio de 4 a 7onzas (113 a 198g) por semana despus de los 4 [address]  [address] de vida.  Aumenta de Eastonpeso, Merigolddiariamente, de Talmagemanera uniforme a Glass blower/designerpartir de los 5 [address]  [address] de vida, sin Passenger transport managerregistrar prdida de peso despus de las 2semanas de vida. Despus de alimentarse, es posible que el beb regurgite una pequea cantidad de Nilandleche. Esto es normal. Frecuencia y duracin de la lactancia El amamantamiento frecuente la ayudar a producir  ms Azerbaijanleche y puede prevenir dolores en los pezones y las mamas extremadamente llenas (congestin Round Lakemamaria). Alimente al beb cuando muestre signos de hambre o si siente la necesidad de reducir la congestin de las W[place] Homesteadsmamas. Esto se  denomina "lactancia a demanda". Las seales de que el beb tiene hambre incluyen las siguientes:  Aumento del [place] de [place], [place] o inquietud.  Mueve la cabeza de un lado a otro.  Abre la boca cuando se le toca la mejilla o la comisura de la boca (reflejo de bsqueda).  Aumenta las vocalizaciones, tales como sonidos de succin, se relame los labios, emite arrullos, suspiros o chirridos.  Mueve la [employer] Apparel Group boca y se chupa los dedos o las manos.  Est molesto o llora.  Evite el uso del chupete en las primeras 4 a 6 semanas despus del nacimiento del beb. Despus de este perodo, podr usar un chupete. Las investigaciones demostraron que el uso del chupete durante Financial risk analyst ao de vida del beb disminuye el riesgo de tener el sndrome de muerte sbita del lactante (SMSL). Permita que el nio se alimente en cada mama todo lo que desee. Cuando el beb se desprende o se queda dormido mientras se est alimentando de la primera mama, ofrzcale la segunda. Debido a que, con frecuencia, los recin nacidos estn somnolientos las primeras semanas de vida, es posible que deba despertar al beb para alimentarlo. Los horarios de Acupuncturist de un beb a otro. Sin embargo, las siguientes reglas pueden servir como gua para ayudarla a Lawyer que el beb se alimenta adecuadamente:  Se puede amamantar a los recin nacidos (bebs de 4 semanas o menos de vida) cada 1 a 3 horas.  No deben transcurrir ms de 3 horas durante el da o 5 horas durante la noche sin que se amamante a los recin nacidos.  Debe amamantar al beb un mnimo de 8 veces en un perodo de 24 horas.  Extraccin de Bank of America extraccin y Contractor de la leche materna le permiten  asegurarse de que el beb se alimente exclusivamente de su leche materna, aun en momentos en los que no puede Museum/gallery exhibitions officer. Esto tiene especial importancia si debe regresar al Aleen Campi en el perodo en que an est amamantando o si no puede estar presente en los momentos en que el beb debe alimentarse. Su asesor en lactancia puede ayudarla a Clinical research associate un mtodo de extraccin que funcione mejor para usted y Programmer, systems cunto tiempo es seguro almacenar [place]. Cmo cuidar las mamas durante la lactancia Los pezones pueden secarse, Lobbyist y doler durante la Market researcher. Las siguientes recomendaciones pueden ayudarla a Pharmacologist las TEPPCO Partners y sanas:  Careers information officer usar jabn en los pezones.  Use un sostn de soporte diseado especialmente para la lactancia materna. Evite usar sostenes con aro o sostenes muy ajustados (sostenes deportivos).  Seque al aire sus pezones durante 3 a despus de amamantar al beb.  Utilice solo apsitos de Haematologist sostn para Environmental health practitioner las prdidas de [place]. La prdida de un poco de Public Service Enterprise Group tomas es normal.  Utilice lanolina sobre los pezones luego de Museum/gallery exhibitions officer. La lanolina ayuda a mantener la humedad normal de la piel. La lanolina pura no es perjudicial (no es txica) para el beb. Adems, puede extraer Beazer Homes algunas gotas de [place] materna y Engineer, maintenance (IT) suavemente esa ToysRus pezones para que la [place] se seque al aire.  Durante las primeras semanas despus del nacimiento, algunas mujeres experimentan [place]. La congestin [employer] Corporation puede hacer que sienta las mamas pesadas, calientes y sensibles al tacto. El pico de la congestin mamaria ocurre en el plazo de los 3 a 5 das despus del [place]. Las siguientes recomendaciones pueden  ayudarla a Technical sales engineer congestin mamaria:  Vace por completo las mamas al QUALCOMM o DTE Energy Company. Puede aplicar calor hmedo en las mamas (en la ducha o con toallas hmedas para manos)  antes de Museum/gallery exhibitions officer o extraer WPS Resources. Esto aumenta la circulacin y [place] a que la [place]. Si el beb no vaca por completo las [address] cuando lo [address], extraiga la [place] restante despus de que haya finalizado.  Aplique compresas de hielo Yahoo! Inc inmediatamente despus de Museum/gallery exhibitions officer o extraer [place], a menos que le resulte demasiado incmodo. Haga lo siguiente: ? Ponga el hielo en una bolsa plstica. ? Coloque una FirstEnergy Corp piel y la bolsa de hielo. ? Coloque el hielo durante , 2 o 3veces por da.  Asegrese de que el beb est prendido y se encuentre en la posicin correcta mientras lo alimenta.  Si la congestin mamaria persiste luego de 48 horas o despus de seguir estas recomendaciones, comunquese con su mdico o un Holiday representative. Recomendaciones de salud general durante la lactancia  Consuma 3 comidas y 3 colaciones saludables todos los [place]. Las M.D.C. Holdings bien alimentadas que amamantan necesitan entre 450 y 500 caloras adicionales por Futures trader. Puede cumplir con este requisito al aumentar la cantidad de una dieta equilibrada que realice.  Beba suficiente agua para mantener la orina clara o de color amarillo plido.  Descanse con frecuencia, reljese y siga tomando sus vitaminas prenatales para prevenir la fatiga, el estrs y los niveles bajos de vitaminas y [employer] en el cuerpo (deficiencias de nutrientes).  No consuma ningn producto que contenga nicotina o tabaco, como cigarrillos y Administrator, Civil Service. El beb puede verse afectado por las sustancias qumicas de los cigarrillos que pasan a la [place] materna y por la exposicin al humo ambiental del tabaco. Si necesita ayuda para dejar de fumar, consulte al mdico.  Evite el consumo de alcohol.  No consuma drogas ilegales o marihuana.  Antes de Dietitian, hable con el mdico. Estos incluyen medicamentos recetados y de [place], como tambin vitaminas y suplementos a base de hierbas.  Algunos medicamentos, que pueden ser perjudiciales para el beb, pueden pasar a travs de la [employer].  Puede quedar embarazada durante la lactancia. Si se desea un mtodo anticonceptivo, consulte al mdico sobre cules son las opciones seguras durante la Market researcher. Dnde encontrar ms informacin: Liga internacional [place]: [email or web address]/. Comunquese con un mdico si:  Siente que quiere dejar de Museum/gallery exhibitions officer o se siente frustrada con la lactancia.  Sus pezones estn agrietados o Water quality scientist.  Sus mamas estn irritadas, sensibles o calientes.  Tiene los siguientes sntomas: ? Dolor en las mamas o en los pezones. ? Un rea hinchada en cualquiera de las mamas. ? Grant Ruts o escalofros. ? Nuseas o vmitos. ? Drenaje de otro lquido distinto de la [employer] materna desde los pezones.  Sus mamas no se llenan antes de Museum/gallery exhibitions officer al beb para el quinto da despus del [place].  Se siente triste y deprimida.  El beb: ? Est demasiado somnoliento como para comer bien. ? Tiene problemas para dormir. ? Tiene ms de 1 semana de vida y [employer] de 6 paales en un periodo de 24 horas. ? No ha aumentado de [place] a los [address] de [address]  El beb defeca menos de 3 veces en 24 horas.  La piel del beb o las partes blancas de los ojos se vuelven amarillentas. Solicite ayuda de inmediato si:  El beb est muy cansado Retail buyer) y no se quiere  despertar para comer.  Le sube la fiebre sin causa. Resumen  La lactancia materna ofrece muchos beneficios de salud para bebs y Cutlermadres.  Intente amamantar a su beb cuando muestre signos tempranos de hambre.  Haga cosquillas o empuje suavemente los labios del beb con el dedo o el pezn para lograr que el beb abra la boca. Acerque el beb a la mama. Asegrese de que la mayor parte de la arola se encuentre dentro de la boca del beb. Ofrzcale una mama y haga eructar al beb antes de pasar a la otra.  Hable con su mdico o asesor en lactancia si tiene dudas o  problemas con la lactancia. Esta informacin no tiene Theme park managercomo fin reemplazar el consejo del mdico. Asegrese de hacerle al mdico cualquier pregunta que tenga. Document Released: 04/21/2005 Document Revised: 08/11/2016 Document Reviewed: 08/11/2016 Elsevier Interactive Patient Education  2018 [employer]

## 2017-11-17 ENCOUNTER — Ambulatory Visit (INDEPENDENT_AMBULATORY_CARE_PROVIDER_SITE_OTHER): Payer: Self-pay | Admitting: Obstetrics and Gynecology

## 2017-11-17 VITALS — BP 97/66 | HR 82 | Wt 156.4 lb

## 2017-11-17 DIAGNOSIS — O09522 Supervision of elderly multigravida, second trimester: Secondary | ICD-10-CM

## 2017-11-17 DIAGNOSIS — Z3480 Encounter for supervision of other normal pregnancy, unspecified trimester: Secondary | ICD-10-CM

## 2017-11-17 DIAGNOSIS — O34219 Maternal care for unspecified type scar from previous cesarean delivery: Secondary | ICD-10-CM

## 2017-11-17 DIAGNOSIS — Z758 Other problems related to medical facilities and other health care: Secondary | ICD-10-CM | POA: Insufficient documentation

## 2017-11-17 DIAGNOSIS — Z3482 Encounter for supervision of other normal pregnancy, second trimester: Secondary | ICD-10-CM

## 2017-11-17 DIAGNOSIS — Z789 Other specified health status: Secondary | ICD-10-CM | POA: Insufficient documentation

## 2017-11-17 NOTE — Progress Notes (Signed)
Prenatal Visit Note Date: 11/17/2017 Clinic: [facility]  Subjective:  Emily Andrade is a 37 y.o. G2P1001 at 1765w0d being seen today for ongoing prenatal care.  She is currently monitored for the following issues for this low-risk pregnancy and has Supervision of other normal pregnancy, antepartum; AMA (advanced maternal age) multigravida 35+; Previous cesarean delivery affecting pregnancy, antepartum; Depression; and Asthma on their problem list.  Patient reports had some bleeding/spotting 1-2wks ago but none since.  Contractions: Not present. Vag. Bleeding: None.  Movement: Present. Denies leaking of fluid.   The following portions of the patient's history were reviewed and updated as appropriate: allergies, current medications, past family history, past medical history, past social history, past surgical history and problem list. Problem list updated.  Objective:   Vitals:   11/17/17 0836  BP: 97/66  Pulse: 82  Weight: 156 lb 6.4 oz (70.9 kg)    Fetal Status: Fetal Heart Rate (bpm): 145   Movement: Present     General:  Alert, oriented and cooperative. Patient is in no acute distress.  Skin: Skin is warm and dry. No rash noted.   Cardiovascular: Normal heart rate noted  Respiratory: Normal respiratory effort, no problems with respiration noted  Abdomen: Soft, gravid, appropriate for gestational age. Pain/Pressure: Absent     Pelvic:  Cervical exam deferred        Extremities: Normal range of motion.  Edema: None  Mental Status: Normal mood and affect. Normal behavior. Normal judgment and thought content.   Urinalysis:      Assessment and Plan:  Pregnancy: G2P1001 at 6665w0d  1. Supervision of other normal pregnancy, antepartum Routine care. Set up for [facility] anatomy u/s at 18-22wks. Pt told to let us know when bleeding/spotting happens but I told her nothing to worry about now.   2. Multigravida of advanced maternal age in second  trimester No issues  3. Previous cesarean delivery affecting pregnancy, antepartum D/w pt later in pregnancy  Interpreter used  Preterm labor symptoms and general obstetric precautions including but not limited to vaginal bleeding, contractions, leaking of fluid and fetal movement were reviewed in detail with the patient. Please refer to After Visit Summary for other counseling recommendations.  Return in about 1 month (around 12/15/2017).   [place] BingPickens, Arsenio Schnorr, MD

## 2017-12-08 ENCOUNTER — Telehealth: Payer: Self-pay

## 2017-12-08 NOTE — Telephone Encounter (Signed)
Patient reports having some bleeding after wiping today and wanted to know if this was ok. Advised patient that it is ok it looks like this was discuss at last appointment on 11/20/2017 with a provider. I have advised patient to continued to watch bleeding if she start soak 1-2 pads per hour she should follow up with office or mau. Patient advised understanding at this time.

## 2017-12-15 ENCOUNTER — Ambulatory Visit (INDEPENDENT_AMBULATORY_CARE_PROVIDER_SITE_OTHER): Payer: Self-pay | Admitting: Obstetrics and Gynecology

## 2017-12-15 VITALS — BP 109/70 | HR 84 | Wt 162.0 lb

## 2017-12-15 DIAGNOSIS — Z113 Encounter for screening for infections with a predominantly sexual mode of transmission: Secondary | ICD-10-CM

## 2017-12-15 DIAGNOSIS — O209 Hemorrhage in early pregnancy, unspecified: Secondary | ICD-10-CM

## 2017-12-15 NOTE — Progress Notes (Signed)
Prenatal Visit Note Date: 12/15/2017 Clinic: [facility]  Subjective:  Emily MerlesDina Martinez Andrade is a 37 y.o. G2P1001 at 5941w0d being seen today for ongoing prenatal care.  She is currently monitored for the following issues for this high-risk pregnancy and has Supervision of other normal pregnancy, antepartum; AMA (advanced maternal age) multigravida 35+; Previous cesarean delivery affecting pregnancy, antepartum; Depression; Asthma; and Language barrier on their problem list.  Patient reports had some bleeding, spotting mon/tues of last week with wiping. no pain and none since. URI s/s.  Contractions: Not present. Vag. Bleeding: None.  Movement: Present. Denies leaking of fluid.   The following portions of the patient's history were reviewed and updated as appropriate: allergies, current medications, past family history, past medical history, past social history, past surgical history and problem list. Problem list updated.  Objective:   Vitals:   12/15/17 0823  BP: 109/70  Pulse: 84  Weight: 162 lb (73.5 kg)    Fetal Status: Fetal Heart Rate (bpm): 143   Movement: Present     General:  Alert, oriented and cooperative. Patient is in no acute distress.  Skin: Skin is warm and dry. No rash noted.   Cardiovascular: Normal heart rate noted  Respiratory: Normal respiratory effort, no problems with respiration noted  Abdomen: Soft, gravid, appropriate for gestational age. Pain/Pressure: Absent     Pelvic:  EGBUS normal. Vault normal with normal d/c of pregnancy, no blood; cx closed/long/high visually and on bimanual. No cmt  Extremities: Normal range of motion.  Edema: None  Mental Status: Normal mood and affect. Normal behavior. Normal judgment and thought content.   Urinalysis:      Assessment and Plan:  Pregnancy: G2P1001 at 2941w0d  Routine care. Follow up swab. Has anatomy u/s on Thursday. Spanish tolac consent given. Safe OTC med list given.  Interpreter used.    Preterm labor symptoms and general obstetric precautions including but not limited to vaginal bleeding, contractions, leaking of fluid and fetal movement were reviewed in detail with the patient. Please refer to After Visit Summary for other counseling recommendations.  Return in about 4 weeks (around 01/12/2018) for rob.   [place] BingPickens, Demitra Danley, MD

## 2017-12-16 LAB — CERVICOVAGINAL ANCILLARY ONLY
BACTERIAL VAGINITIS: NEGATIVE
CANDIDA VAGINITIS: NEGATIVE
Chlamydia: NEGATIVE
NEISSERIA GONORRHEA: NEGATIVE
TRICH (WINDOWPATH): NEGATIVE

## 2017-12-21 ENCOUNTER — Encounter: Payer: Self-pay | Admitting: Radiology

## 2018-01-12 ENCOUNTER — Ambulatory Visit (INDEPENDENT_AMBULATORY_CARE_PROVIDER_SITE_OTHER): Payer: Self-pay | Admitting: Obstetrics and Gynecology

## 2018-01-12 VITALS — BP 96/56 | HR 72 | Wt 162.6 lb

## 2018-01-12 DIAGNOSIS — O0992 Supervision of high risk pregnancy, unspecified, second trimester: Secondary | ICD-10-CM

## 2018-01-12 DIAGNOSIS — O0990 Supervision of high risk pregnancy, unspecified, unspecified trimester: Secondary | ICD-10-CM

## 2018-01-12 DIAGNOSIS — Z789 Other specified health status: Secondary | ICD-10-CM

## 2018-01-12 DIAGNOSIS — O34219 Maternal care for unspecified type scar from previous cesarean delivery: Secondary | ICD-10-CM

## 2018-01-12 MED ORDER — ASPIRIN EC 81 MG PO TBEC: 81.0000 mg | DELAYED_RELEASE_TABLET | Freq: Every day | ORAL | 2 refills | Status: DC

## 2018-01-12 NOTE — Progress Notes (Addendum)
Prenatal Visit Note Date: 01/12/2018 Clinic: [facility]  Subjective:  Emily Andrade is a 37 y.o. G2P1001 at [redacted]w[redacted]d being seen today for ongoing prenatal care.  She is currently monitored for the following issues for this low-risk pregnancy and has Supervision of high risk pregnancy, antepartum; AMA (advanced maternal age) multigravida 35+; Previous cesarean delivery affecting pregnancy, antepartum; Depression; Asthma; and Language barrier on their problem list.  Patient reports no complaints.   Contractions: Not present. Vag. Bleeding: None.  Movement: Present. Denies leaking of fluid.   The following portions of the patient's history were reviewed and updated as appropriate: allergies, current medications, past family history, past medical history, past social history, past surgical history and problem list. Problem list updated.  Objective:   Vitals:   01/12/18 0822  BP: (!) 96/56  Pulse: 72  Weight: 162 lb 9.6 oz (73.8 kg)    Fetal Status: Fetal Heart Rate (bpm): 160 Fundal Height: 24 cm Movement: Present     General:  Alert, oriented and cooperative. Patient is in no acute distress.  Skin: Skin is warm and dry. No rash noted.   Cardiovascular: Normal heart rate noted  Respiratory: Normal respiratory effort, no problems with respiration noted  Abdomen: Soft, gravid, appropriate for gestational age. Pain/Pressure: Absent     Pelvic:  Cervical exam deferred        Extremities: Normal range of motion.  Edema: None  Mental Status: Normal mood and affect. Normal behavior. Normal judgment and thought content.   Urinalysis:      Assessment and Plan:  Pregnancy: G2P1001 at [redacted]w[redacted]d  1. Supervision of high risk pregnancy, antepartum Routine care. 28wk labs nv. Pt amenable to starting low dose asa.   2. Language barrier inerpreter used  3. Previous cesarean delivery affecting pregnancy, antepartum Op note reviewed and pt desires tolac. Consent signed  today  Preterm labor symptoms and general obstetric precautions including but not limited to vaginal bleeding, contractions, leaking of fluid and fetal movement were reviewed in detail with the patient. Please refer to After Visit Summary for other counseling recommendations.  Return in about 3 weeks (around 02/02/2018) for rob and 2hr GTT.   Yell Bing, MD

## 2018-02-02 ENCOUNTER — Encounter: Payer: Self-pay | Admitting: Obstetrics & Gynecology

## 2018-02-02 ENCOUNTER — Ambulatory Visit (INDEPENDENT_AMBULATORY_CARE_PROVIDER_SITE_OTHER): Payer: Self-pay | Admitting: Obstetrics & Gynecology

## 2018-02-02 VITALS — BP 94/59 | HR 81 | Wt 165.8 lb

## 2018-02-02 DIAGNOSIS — O09523 Supervision of elderly multigravida, third trimester: Secondary | ICD-10-CM

## 2018-02-02 DIAGNOSIS — M549 Dorsalgia, unspecified: Secondary | ICD-10-CM

## 2018-02-02 DIAGNOSIS — O34219 Maternal care for unspecified type scar from previous cesarean delivery: Secondary | ICD-10-CM

## 2018-02-02 DIAGNOSIS — O0990 Supervision of high risk pregnancy, unspecified, unspecified trimester: Secondary | ICD-10-CM

## 2018-02-02 DIAGNOSIS — O9989 Other specified diseases and conditions complicating pregnancy, childbirth and the puerperium: Secondary | ICD-10-CM

## 2018-02-02 DIAGNOSIS — O99891 Other specified diseases and conditions complicating pregnancy: Secondary | ICD-10-CM

## 2018-02-02 LAB — CBC
Hematocrit: 29.7 % — ABNORMAL LOW (ref 34.0–46.6)
Hemoglobin: 10.5 g/dL — ABNORMAL LOW (ref 11.1–15.9)
MCH: 29.2 pg (ref 26.6–33.0)
MCHC: 35.4 g/dL (ref 31.5–35.7)
MCV: 83 fL (ref 79–97)
Platelets: 223 10*3/uL (ref 150–450)
RBC: 3.6 x10E6/uL — ABNORMAL LOW (ref 3.77–5.28)
RDW: 12.7 % (ref 12.3–15.4)
WBC: 6.4 10*3/uL (ref 3.4–10.8)

## 2018-02-02 MED ORDER — CYCLOBENZAPRINE HCL 10 MG PO TABS: 10.0000 mg | ORAL_TABLET | Freq: Three times a day (TID) | ORAL | 1 refills | Status: DC | PRN

## 2018-02-02 NOTE — Progress Notes (Signed)
   PRENATAL VISIT NOTE  Subjective:  Emily Andrade is a 37 y.o. G2P1001 at [redacted]w[redacted]d being seen today for ongoing prenatal care.  Patient is Spanish-speaking only, Spanish interpreter present for this encounter. She is currently monitored for the following issues for this high-risk pregnancy and has Supervision of high risk pregnancy, antepartum; AMA (advanced maternal age) multigravida 35+; Previous cesarean delivery affecting pregnancy, antepartum; Depression; Asthma; and Language barrier on their problem list.  Patient reports neck and back pain for a few days now, on her right side. Definitely localized. Alleviated a little by Tylenol.  Contractions: Not present. Vag. Bleeding: None.  Movement: Present. Denies leaking of fluid.   The following portions of the patient's history were reviewed and updated as appropriate: allergies, current medications, past family history, past medical history, past social history, past surgical history and problem list. Problem list updated.  Objective:   Vitals:   02/02/18 0840  BP: (!) 94/59  Pulse: 81  Weight: 165 lb 12.8 oz (75.2 kg)    Fetal Status: Fetal Heart Rate (bpm): 146   Movement: Present     General:  Alert, oriented and cooperative. Patient is in no acute distress.  Skin: Skin is warm and dry. No rash noted.   Cardiovascular: Normal heart rate noted  Respiratory: Normal respiratory effort, no problems with respiration noted  Abdomen: Soft, gravid, appropriate for gestational age.  Pain/Pressure: Absent     Pelvic: Cervical exam deferred        Extremities: Normal range of motion.  Edema: Trace  Mental Status: Normal mood and affect. Normal behavior. Normal judgment and thought content.   Assessment and Plan:  Pregnancy: G2P1001 at [redacted]w[redacted]d  1. Previous cesarean delivery affecting pregnancy, antepartum Desires TOLAC, already signed consent  2. Back pain during pregnancy Advised to get massages, warm compresses and take Tylenol  prn. Flexeril prescribed as needed.  - cyclobenzaprine (FLEXERIL) 10 MG tablet; Take 1 tablet (10 mg total) by mouth every 8 (eight) hours as needed for muscle spasms.  Dispense: 30 tablet; Refill: 1  3. Multigravida of advanced maternal age in third trimester 4. Supervision of high risk pregnancy, antepartum Third trimester labs today, will get vaccinations at [facility] - Glucose Tolerance, 2 Hours w/1 Hour - HIV Antibody (routine testing w rflx) - RPR - CBC  Preterm labor symptoms and general obstetric precautions including but not limited to vaginal bleeding, contractions, leaking of fluid and fetal movement were reviewed in detail with the patient. Please refer to After Visit Summary for other counseling recommendations.  Return in about 2 weeks (around 02/16/2018) for OB Visit.  Future Appointments  Date Time Provider Department Center  02/16/2018 10:45 AM Reva Bores, MD [facility]    Jaynie Collins, MD

## 2018-02-02 NOTE — Patient Instructions (Signed)
Return to clinic for any scheduled appointments or obstetric concerns, or go to [facility] for evaluation  

## 2018-02-03 LAB — GLUCOSE TOLERANCE, 2 HOURS W/ 1HR
GLUCOSE, 1 HOUR: 148 mg/dL (ref 65–179)
GLUCOSE, FASTING: 78 mg/dL (ref 65–91)
Glucose, 2 hour: 101 mg/dL (ref 65–152)

## 2018-02-03 LAB — RPR: RPR: NONREACTIVE

## 2018-02-03 LAB — HIV ANTIBODY (ROUTINE TESTING W REFLEX): HIV Screen 4th Generation wRfx: NONREACTIVE

## 2018-02-16 ENCOUNTER — Ambulatory Visit (INDEPENDENT_AMBULATORY_CARE_PROVIDER_SITE_OTHER): Payer: Self-pay | Admitting: Family Medicine

## 2018-02-16 VITALS — BP 99/65 | HR 72 | Wt 165.0 lb

## 2018-02-16 DIAGNOSIS — O0990 Supervision of high risk pregnancy, unspecified, unspecified trimester: Secondary | ICD-10-CM

## 2018-02-16 NOTE — Progress Notes (Signed)
   PRENATAL VISIT NOTE  Subjective:  Emily Andrade is a 37 y.o. G2P1001 at [redacted]w[redacted]d being seen today for ongoing prenatal care.  She is currently monitored for the following issues for this low-risk pregnancy and has Supervision of high risk pregnancy, antepartum; AMA (advanced maternal age) multigravida 35+; Previous cesarean delivery affecting pregnancy, antepartum; Depression; Asthma; and Language barrier on their problem list.  Patient reports no complaints.  Contractions: Not present.  .  Movement: Present. Denies leaking of fluid.   The following portions of the patient's history were reviewed and updated as appropriate: allergies, current medications, past family history, past medical history, past social history, past surgical history and problem list. Problem list updated.  Objective:   Vitals:   02/16/18 1057  BP: 99/65  Pulse: 72  Weight: 165 lb (74.8 kg)    Fetal Status: Fetal Heart Rate (bpm): 161 Fundal Height: 30 cm Movement: Present     General:  Alert, oriented and cooperative. Patient is in no acute distress.  Skin: Skin is warm and dry. No rash noted.   Cardiovascular: Normal heart rate noted  Respiratory: Normal respiratory effort, no problems with respiration noted  Abdomen: Soft, gravid, appropriate for gestational age.  Pain/Pressure: Absent     Pelvic: Cervical exam deferred        Extremities: Normal range of motion.  Edema: Trace  Mental Status: Normal mood and affect. Normal behavior. Normal judgment and thought content.   Assessment and Plan:  Pregnancy: G2P1001 at [redacted]w[redacted]d  1. Supervision of high risk pregnancy, antepartum Passed 2 hour BP stable Has appointment [facility] immunizations [facility] on 02/23/18  Preterm labor symptoms and general obstetric precautions including but not limited to vaginal bleeding, contractions, leaking of fluid and fetal movement were reviewed in detail with the patient. Please refer to After Visit Summary for other counseling  recommendations.  Return in 2 weeks (on 03/02/2018).  Future Appointments  Date Time Provider Department Center  03/04/2018 10:00 AM Anyanwu, Jethro Bastos, MD [facility]    Reva Bores, MD

## 2018-02-16 NOTE — Patient Instructions (Signed)

## 2018-03-04 ENCOUNTER — Ambulatory Visit (INDEPENDENT_AMBULATORY_CARE_PROVIDER_SITE_OTHER): Payer: Self-pay | Admitting: Obstetrics & Gynecology

## 2018-03-04 VITALS — BP 93/63 | HR 72 | Wt 166.6 lb

## 2018-03-04 DIAGNOSIS — O0990 Supervision of high risk pregnancy, unspecified, unspecified trimester: Secondary | ICD-10-CM

## 2018-03-04 DIAGNOSIS — O34219 Maternal care for unspecified type scar from previous cesarean delivery: Secondary | ICD-10-CM

## 2018-03-04 DIAGNOSIS — O0993 Supervision of high risk pregnancy, unspecified, third trimester: Secondary | ICD-10-CM

## 2018-03-04 NOTE — Progress Notes (Signed)
   PRENATAL VISIT NOTE  Subjective:  Emily Andrade is a 37 y.o. G2P1001 at [redacted]w[redacted]d being seen today for ongoing prenatal care.  Due to language barrier, a Spanish phone [employer] [phone]) interpreter was present during the history-taking and subsequent discussion (and for part of the physical exam) with this patient. She is currently monitored for the following issues for this low-risk pregnancy and has Supervision of high risk pregnancy, antepartum; AMA (advanced maternal age) multigravida 35+; Previous cesarean delivery affecting pregnancy, antepartum; Depression; Asthma; and Language barrier on their problem list.  Patient reports no complaints.  Contractions: Not present.  .  Movement: Present. Denies leaking of fluid.   The following portions of the patient's history were reviewed and updated as appropriate: allergies, current medications, past family history, past medical history, past social history, past surgical history and problem list. Problem list updated.  Objective:   Vitals:   03/04/18 0955  BP: 93/63  Pulse: 72  Weight: 166 lb 9.6 oz (75.6 kg)    Fetal Status: Fetal Heart Rate (bpm): 162   Movement: Present     General:  Alert, oriented and cooperative. Patient is in no acute distress.  Skin: Skin is warm and dry. No rash noted.   Cardiovascular: Normal heart rate noted  Respiratory: Normal respiratory effort, no problems with respiration noted  Abdomen: Soft, gravid, appropriate for gestational age.  Pain/Pressure: Present     Pelvic: Cervical exam deferred        Extremities: Normal range of motion.  Edema: Trace  Mental Status: Normal mood and affect. Normal behavior. Normal judgment and thought content.   Assessment and Plan:  Pregnancy: G2P1001 at [redacted]w[redacted]d  1. Previous cesarean delivery affecting pregnancy, antepartum Desires TOLAC  2. Supervision of high risk pregnancy, antepartum Preterm labor symptoms and general obstetric precautions including  but not limited to vaginal bleeding, contractions, leaking of fluid and fetal movement were reviewed in detail with the patient. Please refer to After Visit Summary for other counseling recommendations.  Return in about 2 weeks (around 03/18/2018) for OB Visit.   Jaynie Collins, MD

## 2018-03-04 NOTE — Patient Instructions (Signed)
Regrese a la clinica cuando tenga su cita. Si tiene problemas o preguntas, llama a la clinica o vaya a la sala [facility]encia al [facility].    

## 2018-03-18 ENCOUNTER — Ambulatory Visit (INDEPENDENT_AMBULATORY_CARE_PROVIDER_SITE_OTHER): Payer: Self-pay | Admitting: Obstetrics & Gynecology

## 2018-03-18 VITALS — BP 103/67 | HR 82 | Wt 168.0 lb

## 2018-03-18 DIAGNOSIS — Z3483 Encounter for supervision of other normal pregnancy, third trimester: Secondary | ICD-10-CM

## 2018-03-18 DIAGNOSIS — O34219 Maternal care for unspecified type scar from previous cesarean delivery: Secondary | ICD-10-CM

## 2018-03-18 DIAGNOSIS — Z3480 Encounter for supervision of other normal pregnancy, unspecified trimester: Secondary | ICD-10-CM

## 2018-03-18 DIAGNOSIS — O2200 Varicose veins of lower extremity in pregnancy, unspecified trimester: Secondary | ICD-10-CM

## 2018-03-18 DIAGNOSIS — O2203 Varicose veins of lower extremity in pregnancy, third trimester: Secondary | ICD-10-CM

## 2018-03-18 NOTE — Patient Instructions (Signed)
Venas varicosas  (Varicose Veins)  Las venas varicosas son venas que se han agrandado y tornado sinuosas. Suelen aparecer en las piernas, pero también pueden verse en otra parte del cuerpo.  CAUSAS  Esta afección se presenta como consecuencia del mal funcionamiento de las válvulas de las venas, las cuales ayudan al retorno de la sangre desde las piernas hacia el corazón. Si estas válvulas se dañan, la sangre retrocede y regresa a las venas de la pierna, cerca de la superficie de la piel, lo que causa dilatación venosa.  FACTORES DE RIESGO  Las personas que están mucho tiempo paradas, las embarazadas o las personas con sobrepeso tienen más probabilidades de tener venas varicosas.  SIGNOS Y SÍNTOMAS  · Venas abultadas, azuladas y de aspecto sinuoso que se observan con mayor frecuencia en las piernas.  · Dolor o sensación de pesadez en las piernas. Estos síntomas pueden empeorar al final del día.  · Hinchazón de las piernas.  · Cambios en el color de la piel.    DIAGNÓSTICO  Generalmente, el médico puede diagnosticar las venas varicosas al examinarle las piernas. Además, puede recomendarle que se haga una ecografía de las venas de las piernas.  TRATAMIENTO  La mayoría de las venas varicosas pueden tratarse en casa. Sin embargo, hay otros tratamientos a disposición de las personas que tienen síntomas persistentes o desean mejorar la apariencia estética de las venas varicosas. Estas opciones de tratamiento incluyen lo siguiente:  · Escleroterapia. Se inyecta una solución en la vena para anularla.  · Tratamiento con láser. Se usa un rayo láser para calentar la vena y anularla.  · Ablación venosa por radiofrecuencia Se usa una corriente eléctrica que se produce mediante ondas de radio para anular la vena.  · Flebectomía. Se extirpa quirúrgicamente la vena a través de pequeñas incisiones que se hacen sobre la vena varicosa.  · Ligadura venosa y varicectomía. La vena se extirpa quirúrgicamente a través de incisiones que se  realizan sobre la vena varicosa después de haberla anudado (ligado).  INSTRUCCIONES PARA EL CUIDADO EN EL HOGAR  · No permanezca sentado o de pie en una posición durante mucho tiempo. No se siente con las piernas cruzadas. Descanse con las piernas elevadas durante el día.  · Use medias de compresión como le haya indicado su médico. Estas medias ayudan a evitar la formación de coágulos sanguíneos y a reducir la hinchazón de las piernas.  · No use otras prendas que le ajusten todo el contorno de las piernas, la pelvis o la cintura.  · Camine todo lo posible para aumentar la circulación de la sangre.  · A la noche, eleve el pie de la cama con bloques de 2 pulgadas.  · Si tiene un corte en la piel sobre la vena y la vena sangra, recuéstese con la pierna elevada y ejerza presión en el lugar con un paño limpio, hasta que deje de sangrar. Luego aplique un apósito (vendaje) sobre el corte. Consulte al médico si el sangrado continúa.    SOLICITE ATENCIÓN MÉDICA SI:  · La piel alrededor del tobillo empieza a agrietarse.  · Siente dolor, hay enrojecimiento, sensibilidad o hinchazón dura en la pierna sobre una vena.  · Está incómodo debido al dolor de la pierna.    Esta información no tiene como fin reemplazar el consejo del médico. Asegúrese de hacerle al médico cualquier pregunta que tenga.  Document Released: 01/29/2005 Document Revised: 08/13/2015 Document Reviewed: 10/23/2015  Elsevier Interactive Patient Education © 2017 Elsevier Inc.

## 2018-03-18 NOTE — Progress Notes (Signed)
   PRENATAL VISIT NOTE  Subjective:  Emily Andrade is a 37 y.o. G2P1001 at 4735w2d being seen today for ongoing prenatal care. Patient is Spanish-speaking only, Spanish interpreter present for this encounter. She is currently monitored for the following issues for this high-risk pregnancy and has Supervision of high risk pregnancy, antepartum; AMA (advanced maternal age) multigravida 35+; Previous cesarean delivery affecting pregnancy, antepartum; Depression; Asthma; and Language barrier on their problem list.  Patient reported varicose vein on left ankle that is causing some pains. Contractions: Irritability. Vag. Bleeding: None.  Movement: Absent. Denies leaking of fluid.   The following portions of the patient's history were reviewed and updated as appropriate: allergies, current medications, past family history, past medical history, past social history, past surgical history and problem list. Problem list updated.  Objective:   Vitals:   03/18/18 1003  BP: 103/67  Pulse: 82  Weight: 168 lb (76.2 kg)    Fetal Status: Fetal Heart Rate (bpm): 150 Fundal Height: 34 cm Movement: Absent     General:  Alert, oriented and cooperative. Patient is in no acute distress.  Skin: Skin is warm and dry. No rash noted.   Cardiovascular: Normal heart rate noted  Respiratory: Normal respiratory effort, no problems with respiration noted  Abdomen: Soft, gravid, appropriate for gestational age.  Pain/Pressure: Present     Pelvic: Cervical exam deferred        Extremities: Normal range of motion.  Edema: Trace Small superficial varicose vein seen near left ankle, no erythema.  Mental Status: Normal mood and affect. Normal behavior. Normal judgment and thought content.   Assessment and Plan:  Pregnancy: G2P1001 at 7235w2d  1. Previous cesarean delivery affecting pregnancy, antepartum Desires TOLAC, consent already signed.  2. Varicose veins during pregnancy, antepartum Encouraged warm  compresses, Tylenol, compression stockings.  Will continue to monitor.   3. Supervision of other normal pregnancy, antepartum Preterm labor symptoms and general obstetric precautions including but not limited to vaginal bleeding, contractions, leaking of fluid and fetal movement were reviewed in detail with the patient. Please refer to After Visit Summary for other counseling recommendations.   Return in about 2 weeks (around 04/01/2018) for OB Visit, Pelvic cultures **[employer]** Spanish speaking.  Future Appointments  Date Time Provider Department Center  03/30/2018  2:15 PM Zonia Caplin, Jethro BastosUgonna A, MD [facility]    Jaynie CollinsUgonna Sharna Gabrys, MD

## 2018-03-30 ENCOUNTER — Ambulatory Visit (INDEPENDENT_AMBULATORY_CARE_PROVIDER_SITE_OTHER): Payer: Self-pay | Admitting: Obstetrics & Gynecology

## 2018-03-30 VITALS — BP 104/68 | HR 72 | Wt 169.6 lb

## 2018-03-30 DIAGNOSIS — Z3A36 36 weeks gestation of pregnancy: Secondary | ICD-10-CM

## 2018-03-30 DIAGNOSIS — O34219 Maternal care for unspecified type scar from previous cesarean delivery: Secondary | ICD-10-CM

## 2018-03-30 DIAGNOSIS — O0990 Supervision of high risk pregnancy, unspecified, unspecified trimester: Secondary | ICD-10-CM

## 2018-03-30 DIAGNOSIS — Z113 Encounter for screening for infections with a predominantly sexual mode of transmission: Secondary | ICD-10-CM

## 2018-03-30 NOTE — Progress Notes (Signed)
   PRENATAL VISIT NOTE  Subjective:  Emily Andrade is a 37 y.o. G2P1001 at 5672w0d being seen today for ongoing prenatal care.  She is currently monitored for the following issues for this high-risk pregnancy and has Supervision of high risk pregnancy, antepartum; AMA (advanced maternal age) multigravida 35+; Previous cesarean delivery affecting pregnancy, antepartum; Depression; Asthma; and Language barrier on their problem list.  Patient reports no complaints.  Contractions: Irritability. Vag. Bleeding: None.  Movement: Present. Denies leaking of fluid.   The following portions of the patient's history were reviewed and updated as appropriate: allergies, current medications, past family history, past medical history, past social history, past surgical history and problem list. Problem list updated.  Objective:   Vitals:   03/30/18 1352  BP: 104/68  Pulse: 72  Weight: 169 lb 9.6 oz (76.9 kg)    Fetal Status: Fetal Heart Rate (bpm): 154 Fundal Height: 37 cm Movement: Present  Presentation: Vertex  General:  Alert, oriented and cooperative. Patient is in no acute distress.  Skin: Skin is warm and dry. No rash noted.   Cardiovascular: Normal heart rate noted  Respiratory: Normal respiratory effort, no problems with respiration noted  Abdomen: Soft, gravid, appropriate for gestational age.  Pain/Pressure: Present     Pelvic: Cervical exam performed Dilation: 4 Effacement (%): 70 Station: Ballotable  Extremities: Normal range of motion.  Edema: Trace  Mental Status: Normal mood and affect. Normal behavior. Normal judgment and thought content.   Assessment and Plan:  Pregnancy: G2P1001 at 7072w0d  1. Previous cesarean delivery affecting pregnancy, antepartum Consented for TOLAC.  2. Supervision of high risk pregnancy, antepartum Pelvic cultures done. - Strep Gp B NAA - Cervicovaginal ancillary only Preterm labor symptoms and general obstetric precautions including but not  limited to vaginal bleeding, contractions, leaking of fluid and fetal movement were reviewed in detail with the patient. Please refer to After Visit Summary for other counseling recommendations.  Return in about 1 week (around 04/06/2018) for OB Visit.  Future Appointments  Date Time Provider Department Center  03/30/2018  2:15 PM Rogan Wigley, Jethro BastosUgonna A, MD [facility]  04/06/2018  2:15 PM Takuma Cifelli, Jethro BastosUgonna A, MD [facility]  04/13/2018  8:45 AM Reva BoresPratt, Tanya S, MD [facility]    Jaynie CollinsUgonna Leilyn Frayre, MD

## 2018-03-30 NOTE — Patient Instructions (Signed)
Regrese a la clinica cuando tenga su cita. Si tiene problemas o preguntas, llama a la clinica o vaya a la sala [facility]encia al [facility].    

## 2018-03-31 LAB — CERVICOVAGINAL ANCILLARY ONLY
CHLAMYDIA, DNA PROBE: NEGATIVE
NEISSERIA GONORRHEA: NEGATIVE

## 2018-04-01 LAB — STREP GP B NAA: STREP GROUP B AG: NEGATIVE

## 2018-04-06 ENCOUNTER — Ambulatory Visit (INDEPENDENT_AMBULATORY_CARE_PROVIDER_SITE_OTHER): Payer: Self-pay | Admitting: Obstetrics & Gynecology

## 2018-04-06 VITALS — BP 108/73 | HR 87 | Wt 170.0 lb

## 2018-04-06 DIAGNOSIS — O34219 Maternal care for unspecified type scar from previous cesarean delivery: Secondary | ICD-10-CM

## 2018-04-06 DIAGNOSIS — O0993 Supervision of high risk pregnancy, unspecified, third trimester: Secondary | ICD-10-CM

## 2018-04-06 DIAGNOSIS — O0990 Supervision of high risk pregnancy, unspecified, unspecified trimester: Secondary | ICD-10-CM

## 2018-04-06 NOTE — Patient Instructions (Signed)
Regrese a la clinica cuando tenga su cita. Si tiene problemas o preguntas, llama a la clinica o vaya a la sala [facility]encia al [facility].    

## 2018-04-06 NOTE — Progress Notes (Signed)
   PRENATAL VISIT NOTE  Subjective:  Emily Andrade is a 37 y.o. G2P1001 at 6685w0d being seen today for ongoing prenatal care. Patient is Spanish-speaking only, Spanish interpreter present for this encounter. She is currently monitored for the following issues for this low-risk pregnancy and has Supervision of high risk pregnancy, antepartum; AMA (advanced maternal age) multigravida 35+; Previous cesarean delivery affecting pregnancy, antepartum; Depression; Asthma; and Language barrier on their problem list.  Patient reports occasional contractions.  Contractions: Irregular. Vag. Bleeding: None.  Movement: Present. Denies leaking of fluid.   The following portions of the patient's history were reviewed and updated as appropriate: allergies, current medications, past family history, past medical history, past social history, past surgical history and problem list. Problem list updated.  Objective:   Vitals:   04/06/18 1408  BP: 108/73  Pulse: 87  Weight: 170 lb (77.1 kg)    Fetal Status: Fetal Heart Rate (bpm): 148 Fundal Height: 38 cm Movement: Present  Presentation: Vertex  General:  Alert, oriented and cooperative. Patient is in no acute distress.  Skin: Skin is warm and dry. No rash noted.   Cardiovascular: Normal heart rate noted  Respiratory: Normal respiratory effort, no problems with respiration noted  Abdomen: Soft, gravid, appropriate for gestational age.  Pain/Pressure: Present     Pelvic: Cervical exam performed Dilation: 4 Effacement (%): 70 Station: Ballotable  Extremities: Normal range of motion.  Edema: Trace  Mental Status: Normal mood and affect. Normal behavior. Normal judgment and thought content.    Results for orders placed or performed in visit on 03/30/18 (from the past 336 hour(s))  Cervicovaginal ancillary only   Collection Time: 03/30/18 12:00 AM  Result Value Ref Range   Chlamydia Negative    Neisseria gonorrhea Negative   Strep Gp B NAA   Collection Time: 03/30/18  2:00 PM  Result Value Ref Range   Strep Gp B NAA Negative Negative    Assessment and Plan:  Pregnancy: G2P1001 at 6085w0d  1. Previous cesarean delivery affecting pregnancy, antepartum Desires TOLAC, has favorable cervix. Consent signed 01/12/18.  2. Supervision of high risk pregnancy, antepartum Term labor symptoms and general obstetric precautions including but not limited to vaginal bleeding, contractions, leaking of fluid and fetal movement were reviewed in detail with the patient. Please refer to After Visit Summary for other counseling recommendations.  Return in about 1 week (around 04/13/2018) for OB Visit.  Future Appointments  Date Time Provider Department Center  04/13/2018  8:45 AM Reva BoresPratt, Tanya S, MD [facility]  04/21/2018 11:30 AM Federico FlakeNewton, Kimberly Niles, MD [facility]    Jaynie CollinsUgonna Rayshad Riviello, MD

## 2018-04-12 NOTE — Progress Notes (Signed)
36 weeks lab completed

## 2018-04-13 ENCOUNTER — Encounter (HOSPITAL_COMMUNITY): Payer: Self-pay

## 2018-04-13 ENCOUNTER — Encounter (HOSPITAL_COMMUNITY): Payer: Self-pay | Admitting: *Deleted

## 2018-04-13 ENCOUNTER — Inpatient Hospital Stay (HOSPITAL_COMMUNITY)
Admission: AD | Admit: 2018-04-13 | Discharge: 2018-04-15 | DRG: 787 | Disposition: A | Discharge status: Home / Self Care | Payer: Medicaid Other | Attending: Family Medicine | Admitting: Family Medicine

## 2018-04-13 ENCOUNTER — Inpatient Hospital Stay (HOSPITAL_COMMUNITY): Payer: Medicaid Other | Admitting: Anesthesiology

## 2018-04-13 ENCOUNTER — Ambulatory Visit (HOSPITAL_COMMUNITY)
Admission: RE | Admit: 2018-04-13 | Discharge: 2018-04-13 | Disposition: A | Discharge status: Home / Self Care | Payer: Medicaid Other | Source: Ambulatory Visit | Attending: Family Medicine | Admitting: Family Medicine

## 2018-04-13 ENCOUNTER — Ambulatory Visit (INDEPENDENT_AMBULATORY_CARE_PROVIDER_SITE_OTHER): Payer: Self-pay | Admitting: Family Medicine

## 2018-04-13 ENCOUNTER — Encounter (HOSPITAL_COMMUNITY)
Admission: AD | Disposition: A | Discharge status: Home / Self Care | Payer: Self-pay | Source: Home / Self Care | Attending: Family Medicine

## 2018-04-13 ENCOUNTER — Other Ambulatory Visit: Payer: Self-pay | Admitting: Family Medicine

## 2018-04-13 VITALS — BP 104/70 | HR 70 | Wt 169.6 lb

## 2018-04-13 DIAGNOSIS — O0990 Supervision of high risk pregnancy, unspecified, unspecified trimester: Secondary | ICD-10-CM

## 2018-04-13 DIAGNOSIS — D62 Acute posthemorrhagic anemia: Secondary | ICD-10-CM | POA: Diagnosis not present

## 2018-04-13 DIAGNOSIS — O34211 Maternal care for low transverse scar from previous cesarean delivery: Principal | ICD-10-CM | POA: Diagnosis present

## 2018-04-13 DIAGNOSIS — Z3A38 38 weeks gestation of pregnancy: Secondary | ICD-10-CM

## 2018-04-13 DIAGNOSIS — O321XX0 Maternal care for breech presentation, not applicable or unspecified: Secondary | ICD-10-CM | POA: Diagnosis present

## 2018-04-13 DIAGNOSIS — O9081 Anemia of the puerperium: Secondary | ICD-10-CM | POA: Diagnosis not present

## 2018-04-13 DIAGNOSIS — O09523 Supervision of elderly multigravida, third trimester: Secondary | ICD-10-CM | POA: Insufficient documentation

## 2018-04-13 DIAGNOSIS — Z98891 History of uterine scar from previous surgery: Secondary | ICD-10-CM

## 2018-04-13 DIAGNOSIS — O0993 Supervision of high risk pregnancy, unspecified, third trimester: Secondary | ICD-10-CM

## 2018-04-13 DIAGNOSIS — O403XX0 Polyhydramnios, third trimester, not applicable or unspecified: Secondary | ICD-10-CM | POA: Diagnosis present

## 2018-04-13 DIAGNOSIS — R339 Retention of urine, unspecified: Secondary | ICD-10-CM | POA: Diagnosis not present

## 2018-04-13 DIAGNOSIS — O34219 Maternal care for unspecified type scar from previous cesarean delivery: Secondary | ICD-10-CM

## 2018-04-13 DIAGNOSIS — O3411 Maternal care for benign tumor of corpus uteri, first trimester: Secondary | ICD-10-CM

## 2018-04-13 DIAGNOSIS — O4413 Placenta previa with hemorrhage, third trimester: Secondary | ICD-10-CM | POA: Insufficient documentation

## 2018-04-13 DIAGNOSIS — O4403 Placenta previa specified as without hemorrhage, third trimester: Secondary | ICD-10-CM | POA: Diagnosis present

## 2018-04-13 LAB — CBC
HCT: 35.3 % — ABNORMAL LOW (ref 36.0–46.0)
Hemoglobin: 11.3 g/dL — ABNORMAL LOW (ref 12.0–15.0)
MCH: 28.4 pg (ref 26.0–34.0)
MCHC: 32 g/dL (ref 30.0–36.0)
MCV: 88.7 fL (ref 80.0–100.0)
Platelets: 259 10*3/uL (ref 150–400)
RBC: 3.98 MIL/uL (ref 3.87–5.11)
RDW: 13.7 % (ref 11.5–15.5)
WBC: 7.4 10*3/uL (ref 4.0–10.5)
nRBC: 0 % (ref 0.0–0.2)

## 2018-04-13 LAB — TYPE AND SCREEN
ABO/RH(D): O POS
Antibody Screen: NEGATIVE

## 2018-04-13 SURGERY — Surgical Case
Anesthesia: Spinal | Wound class: Clean Contaminated

## 2018-04-13 MED ORDER — DIPHENHYDRAMINE HCL 50 MG/ML IJ SOLN: 12.5000 mg | INTRAMUSCULAR | Status: DC | PRN

## 2018-04-13 MED ORDER — HYDROCODONE-ACETAMINOPHEN 7.5-325 MG PO TABS: 1.0000 | ORAL_TABLET | Freq: Once | ORAL | Status: DC | PRN

## 2018-04-13 MED ORDER — DIBUCAINE 1 % RE OINT: 1.0000 "application " | TOPICAL_OINTMENT | RECTAL | Status: DC | PRN

## 2018-04-13 MED ORDER — NALBUPHINE HCL 10 MG/ML IJ SOLN: 5.0000 mg | Freq: Once | INTRAMUSCULAR | Status: DC | PRN

## 2018-04-13 MED ORDER — TETANUS-DIPHTH-ACELL PERTUSSIS 5-2.5-18.5 LF-MCG/0.5 IM SUSP: 0.5000 mL | Freq: Once | INTRAMUSCULAR | Status: DC

## 2018-04-13 MED ORDER — ONDANSETRON HCL 4 MG/2ML IJ SOLN
INTRAMUSCULAR | Status: DC | PRN
  Administered 2018-04-13: 4 mg via INTRAVENOUS

## 2018-04-13 MED ORDER — LACTATED RINGERS IV SOLN
INTRAVENOUS | Status: DC
  Administered 2018-04-13 – 2018-04-14 (×2): via INTRAVENOUS

## 2018-04-13 MED ORDER — MEPERIDINE HCL 25 MG/ML IJ SOLN: 6.2500 mg | INTRAMUSCULAR | Status: DC | PRN

## 2018-04-13 MED ORDER — FAMOTIDINE IN NACL 20-0.9 MG/50ML-% IV SOLN
20.0000 mg | Freq: Once | INTRAVENOUS | Status: AC
  Administered 2018-04-13: 20 mg via INTRAVENOUS
  Filled 2018-04-13: qty 50

## 2018-04-13 MED ORDER — ONDANSETRON HCL 4 MG/2ML IJ SOLN
INTRAMUSCULAR | Status: AC
  Filled 2018-04-13: qty 2

## 2018-04-13 MED ORDER — IBUPROFEN 600 MG PO TABS
600.0000 mg | ORAL_TABLET | Freq: Four times a day (QID) | ORAL | Status: DC
  Administered 2018-04-13 – 2018-04-15 (×7): 600 mg via ORAL
  Filled 2018-04-13 (×7): qty 1

## 2018-04-13 MED ORDER — ZOLPIDEM TARTRATE 5 MG PO TABS: 5.0000 mg | ORAL_TABLET | Freq: Every evening | ORAL | Status: DC | PRN

## 2018-04-13 MED ORDER — FENTANYL CITRATE (PF) 100 MCG/2ML IJ SOLN
INTRAMUSCULAR | Status: AC
  Filled 2018-04-13: qty 2

## 2018-04-13 MED ORDER — WITCH HAZEL-GLYCERIN EX PADS: 1.0000 "application " | MEDICATED_PAD | CUTANEOUS | Status: DC | PRN

## 2018-04-13 MED ORDER — OXYTOCIN 10 UNIT/ML IJ SOLN
INTRAVENOUS | Status: DC | PRN
  Administered 2018-04-13: 40 [IU] via INTRAVENOUS

## 2018-04-13 MED ORDER — CEFAZOLIN SODIUM-DEXTROSE 2-4 GM/100ML-% IV SOLN
2.0000 g | INTRAVENOUS | Status: AC
  Administered 2018-04-13: 2 g via INTRAVENOUS
  Filled 2018-04-13: qty 100

## 2018-04-13 MED ORDER — MORPHINE SULFATE (PF) 0.5 MG/ML IJ SOLN
INTRAMUSCULAR | Status: DC | PRN
  Administered 2018-04-13: .15 mg via EPIDURAL

## 2018-04-13 MED ORDER — MORPHINE SULFATE (PF) 0.5 MG/ML IJ SOLN
INTRAMUSCULAR | Status: AC
  Filled 2018-04-13: qty 10

## 2018-04-13 MED ORDER — DEXAMETHASONE SODIUM PHOSPHATE 4 MG/ML IJ SOLN
INTRAMUSCULAR | Status: DC | PRN
  Administered 2018-04-13: 4 mg via INTRAVENOUS

## 2018-04-13 MED ORDER — FENTANYL CITRATE (PF) 100 MCG/2ML IJ SOLN
INTRAMUSCULAR | Status: DC | PRN
  Administered 2018-04-13: 15 ug via INTRATHECAL

## 2018-04-13 MED ORDER — KETOROLAC TROMETHAMINE 30 MG/ML IJ SOLN: 30.0000 mg | Freq: Four times a day (QID) | INTRAMUSCULAR | Status: AC | PRN

## 2018-04-13 MED ORDER — SIMETHICONE 80 MG PO CHEW: 80.0000 mg | CHEWABLE_TABLET | ORAL | Status: DC | PRN

## 2018-04-13 MED ORDER — OXYTOCIN 10 UNIT/ML IJ SOLN
INTRAMUSCULAR | Status: AC
  Filled 2018-04-13: qty 4

## 2018-04-13 MED ORDER — LACTATED RINGERS IV SOLN
INTRAVENOUS | Status: DC
  Administered 2018-04-13 (×3): via INTRAVENOUS

## 2018-04-13 MED ORDER — KETOROLAC TROMETHAMINE 30 MG/ML IJ SOLN
30.0000 mg | Freq: Four times a day (QID) | INTRAMUSCULAR | Status: AC | PRN
  Administered 2018-04-13: 30 mg via INTRAMUSCULAR

## 2018-04-13 MED ORDER — DIPHENHYDRAMINE HCL 25 MG PO CAPS: 25.0000 mg | ORAL_CAPSULE | ORAL | Status: DC | PRN

## 2018-04-13 MED ORDER — NALBUPHINE HCL 10 MG/ML IJ SOLN: 5.0000 mg | INTRAMUSCULAR | Status: DC | PRN

## 2018-04-13 MED ORDER — KETOROLAC TROMETHAMINE 30 MG/ML IJ SOLN
INTRAMUSCULAR | Status: AC
  Filled 2018-04-13: qty 1

## 2018-04-13 MED ORDER — ONDANSETRON HCL 4 MG/2ML IJ SOLN
4.0000 mg | Freq: Three times a day (TID) | INTRAMUSCULAR | Status: DC | PRN
  Administered 2018-04-13: 4 mg via INTRAVENOUS
  Filled 2018-04-13: qty 2

## 2018-04-13 MED ORDER — PHENYLEPHRINE 8 MG IN D5W 100 ML (0.08MG/ML) PREMIX OPTIME
INJECTION | INTRAVENOUS | Status: AC
  Filled 2018-04-13: qty 100

## 2018-04-13 MED ORDER — COCONUT OIL OIL
1.0000 "application " | TOPICAL_OIL | Status: DC | PRN
  Administered 2018-04-15: 1 via TOPICAL
  Filled 2018-04-13: qty 120

## 2018-04-13 MED ORDER — OXYCODONE-ACETAMINOPHEN 5-325 MG PO TABS
1.0000 | ORAL_TABLET | ORAL | Status: DC | PRN
  Administered 2018-04-14 – 2018-04-15 (×3): 1 via ORAL
  Filled 2018-04-13 (×4): qty 1

## 2018-04-13 MED ORDER — ACETAMINOPHEN 10 MG/ML IV SOLN: 1000.0000 mg | Freq: Once | INTRAVENOUS | Status: DC | PRN

## 2018-04-13 MED ORDER — BUPIVACAINE IN DEXTROSE 0.75-8.25 % IT SOLN
INTRATHECAL | Status: DC | PRN
  Administered 2018-04-13: 1.4 mg via INTRATHECAL

## 2018-04-13 MED ORDER — SIMETHICONE 80 MG PO CHEW
80.0000 mg | CHEWABLE_TABLET | Freq: Three times a day (TID) | ORAL | Status: DC
  Administered 2018-04-14 – 2018-04-15 (×4): 80 mg via ORAL
  Filled 2018-04-13 (×4): qty 1

## 2018-04-13 MED ORDER — SIMETHICONE 80 MG PO CHEW
80.0000 mg | CHEWABLE_TABLET | ORAL | Status: DC
  Administered 2018-04-14: 80 mg via ORAL
  Filled 2018-04-13 (×2): qty 1

## 2018-04-13 MED ORDER — METOCLOPRAMIDE HCL 5 MG/5ML PO SOLN
10.0000 mg | Freq: Once | ORAL | Status: DC
  Filled 2018-04-13: qty 10

## 2018-04-13 MED ORDER — NALOXONE HCL 0.4 MG/ML IJ SOLN: 0.4000 mg | INTRAMUSCULAR | Status: DC | PRN

## 2018-04-13 MED ORDER — PROMETHAZINE HCL 25 MG/ML IJ SOLN: 6.2500 mg | INTRAMUSCULAR | Status: DC | PRN

## 2018-04-13 MED ORDER — HYDROMORPHONE HCL 1 MG/ML IJ SOLN: 0.2500 mg | INTRAMUSCULAR | Status: DC | PRN

## 2018-04-13 MED ORDER — BUPIVACAINE IN DEXTROSE 0.75-8.25 % IT SOLN
INTRATHECAL | Status: AC
  Filled 2018-04-13: qty 2

## 2018-04-13 MED ORDER — MENTHOL 3 MG MT LOZG: 1.0000 | LOZENGE | OROMUCOSAL | Status: DC | PRN

## 2018-04-13 MED ORDER — METOCLOPRAMIDE HCL 5 MG/ML IJ SOLN
10.0000 mg | Freq: Once | INTRAMUSCULAR | Status: AC
  Administered 2018-04-13: 10 mg via INTRAVENOUS
  Filled 2018-04-13: qty 2

## 2018-04-13 MED ORDER — PHENYLEPHRINE 8 MG IN D5W 100 ML (0.08MG/ML) PREMIX OPTIME
INJECTION | INTRAVENOUS | Status: DC | PRN
  Administered 2018-04-13: 60 ug/min via INTRAVENOUS

## 2018-04-13 MED ORDER — DIPHENHYDRAMINE HCL 25 MG PO CAPS: 25.0000 mg | ORAL_CAPSULE | Freq: Four times a day (QID) | ORAL | Status: DC | PRN

## 2018-04-13 MED ORDER — SODIUM CHLORIDE 0.9% FLUSH: 3.0000 mL | INTRAVENOUS | Status: DC | PRN

## 2018-04-13 MED ORDER — SENNOSIDES-DOCUSATE SODIUM 8.6-50 MG PO TABS
2.0000 | ORAL_TABLET | ORAL | Status: DC
  Administered 2018-04-13 – 2018-04-14 (×2): 2 via ORAL
  Filled 2018-04-13 (×2): qty 2

## 2018-04-13 MED ORDER — SODIUM CHLORIDE 0.9 % IR SOLN
Status: DC | PRN
  Administered 2018-04-13: 1000 mL

## 2018-04-13 MED ORDER — PRENATAL MULTIVITAMIN CH
1.0000 | ORAL_TABLET | Freq: Every day | ORAL | Status: DC
  Administered 2018-04-14 – 2018-04-15 (×2): 1 via ORAL
  Filled 2018-04-13 (×2): qty 1

## 2018-04-13 MED ORDER — OXYTOCIN 40 UNITS IN LACTATED RINGERS INFUSION - SIMPLE MED
2.5000 [IU]/h | INTRAVENOUS | Status: AC
  Administered 2018-04-13: 2.5 [IU]/h via INTRAVENOUS

## 2018-04-13 MED ORDER — SOD CITRATE-CITRIC ACID 500-334 MG/5ML PO SOLN
30.0000 mL | ORAL | Status: AC
  Administered 2018-04-13: 30 mL via ORAL
  Filled 2018-04-13: qty 15

## 2018-04-13 MED ORDER — SCOPOLAMINE 1 MG/3DAYS TD PT72: 1.0000 | MEDICATED_PATCH | Freq: Once | TRANSDERMAL | Status: DC

## 2018-04-13 MED ORDER — NALOXONE HCL 4 MG/10ML IJ SOLN: 1.0000 ug/kg/h | INTRAVENOUS | Status: DC | PRN

## 2018-04-13 SURGICAL SUPPLY — 35 items
APL SKNCLS STERI-STRIP NONHPOA (GAUZE/BANDAGES/DRESSINGS) ×1
BENZOIN TINCTURE PRP APPL 2/3 (GAUZE/BANDAGES/DRESSINGS) ×3 IMPLANT
CHLORAPREP W/TINT 26ML (MISCELLANEOUS) ×3 IMPLANT
CLAMP CORD UMBIL (MISCELLANEOUS) IMPLANT
CLOSURE WOUND 1/2 X4 (GAUZE/BANDAGES/DRESSINGS) ×1
CLOTH BEACON ORANGE TIMEOUT ST (SAFETY) ×3 IMPLANT
DRSG OPSITE POSTOP 4X10 (GAUZE/BANDAGES/DRESSINGS) ×3 IMPLANT
ELECT REM PT RETURN 9FT ADLT (ELECTROSURGICAL) ×3
ELECTRODE REM PT RTRN 9FT ADLT (ELECTROSURGICAL) ×1 IMPLANT
EXTRACTOR VACUUM M CUP 4 TUBE (SUCTIONS) IMPLANT
EXTRACTOR VACUUM M CUP 4' TUBE (SUCTIONS)
GAUZE SPONGE 4X4 12PLY STRL LF (GAUZE/BANDAGES/DRESSINGS) ×2 IMPLANT
GLOVE BIOGEL PI IND STRL 7.0 (GLOVE) ×2 IMPLANT
GLOVE BIOGEL PI IND STRL 7.5 (GLOVE) ×2 IMPLANT
GLOVE BIOGEL PI INDICATOR 7.0 (GLOVE) ×4
GLOVE BIOGEL PI INDICATOR 7.5 (GLOVE) ×4
GLOVE ECLIPSE 7.5 STRL STRAW (GLOVE) ×3 IMPLANT
GOWN STRL REUS W/TWL LRG LVL3 (GOWN DISPOSABLE) ×9 IMPLANT
KIT ABG SYR 3ML LUER SLIP (SYRINGE) IMPLANT
NEEDLE HYPO 25X5/8 SAFETYGLIDE (NEEDLE) IMPLANT
NS IRRIG 1000ML POUR BTL (IV SOLUTION) ×3 IMPLANT
PACK C SECTION WH (CUSTOM PROCEDURE TRAY) ×3 IMPLANT
PAD ABD 7.5X8 STRL (GAUZE/BANDAGES/DRESSINGS) ×6 IMPLANT
PAD OB MATERNITY 4.3X12.25 (PERSONAL CARE ITEMS) ×3 IMPLANT
PENCIL SMOKE EVAC W/HOLSTER (ELECTROSURGICAL) ×3 IMPLANT
RTRCTR C-SECT PINK 25CM LRG (MISCELLANEOUS) ×3 IMPLANT
SPONGE LAP 18X18 RF (DISPOSABLE) ×9 IMPLANT
STRIP CLOSURE SKIN 1/2X4 (GAUZE/BANDAGES/DRESSINGS) ×2 IMPLANT
SUT VIC AB 0 CTX 36 (SUTURE) ×9
SUT VIC AB 0 CTX36XBRD ANBCTRL (SUTURE) ×3 IMPLANT
SUT VIC AB 2-0 CT1 27 (SUTURE) ×3
SUT VIC AB 2-0 CT1 TAPERPNT 27 (SUTURE) ×1 IMPLANT
SUT VIC AB 4-0 KS 27 (SUTURE) ×3 IMPLANT
TOWEL OR 17X24 6PK STRL BLUE (TOWEL DISPOSABLE) ×3 IMPLANT
TRAY FOLEY W/BAG SLVR 14FR LF (SET/KITS/TRAYS/PACK) ×3 IMPLANT

## 2018-04-13 NOTE — Transfer of Care (Signed)
Immediate Anesthesia Transfer of Care Note  Patient: Emily Andrade  Procedure(s) Performed: CESAREAN SECTION (N/A )  Patient Location: PACU  Anesthesia Type:Spinal  Level of Consciousness: awake  Airway & Oxygen Therapy: Patient Spontanous Breathing  Post-op Assessment: Report given to RN  Post vital signs: Reviewed and stable  Last Vitals:  Vitals Value Taken Time  BP    Temp    Pulse    Resp    SpO2      Last Pain:  Vitals:   04/13/18 1525  TempSrc:   PainSc: 0-No pain      Patients Stated Pain Goal: 4 (04/13/18 1512)  Complications: No apparent anesthesia complications

## 2018-04-13 NOTE — H&P (Signed)
Faculty Practice H&P  Emily Andrade is a 37 y.o. female G2P1001 with IUP at 102w0d presenting for repeat cesarean section for AMA, posterior placenta previa, breech. She had an [place] today, which showed the suspicion of posterior/marginal previa with dilation to 4cm. She is not having any vaginal bleeding. Pregnancy was been complicated by previous c/s.    Pt states she has been having no contractions, no vaginal bleeding, intact membranes, with normal fetal movement.     Prenatal Course Source of Care: [facility] with onset of care at 9 weeks  Pregnancy complications or risks: Patient Active Problem List   Diagnosis Date Noted  . Language barrier 11/17/2017  . Supervision of high risk pregnancy, antepartum 09/24/2017  . AMA (advanced maternal age) multigravida 35+ 09/24/2017  . Previous cesarean delivery affecting pregnancy, antepartum 09/24/2017  . Depression   . Asthma    She desires vasectomy for contraception.  She plans to breastfeed  Prenatal labs and studies: ABO, Rh: O/Positive/-- (05/23 1056) Antibody: Negative (05/23 1056) Rubella: 1.03 (05/23 1056) RPR: Non Reactive (10/01 0832)  HBsAg: Negative (05/23 1056)  HIV: Non Reactive (10/01 0832)  GBS: Negative (11/26 1400)  2hr Glucola: negative Genetic screening: normal Anatomy US: normal  Past Medical History:  Past Medical History:  Diagnosis Date  . Anemia   . Asthma    childhood  . Depression   . Frequent UTI     Past Surgical History:  Past Surgical History:  Procedure Laterality Date  . CESAREAN SECTION N/A 04/08/2013   Procedure: CESAREAN SECTION;  Surgeon: Reva Bores, MD;  Location: [facility];  Service: Obstetrics;  Laterality: N/A;    Obstetrical History:  OB History    Gravida  2   Para  1   Term  1   Preterm  0   AB  0   Living  1     SAB  0   TAB  0   Ectopic  0   Multiple  0   Live Births  1           Gynecological History:  OB History    Gravida  2   Para  1    Term  1   Preterm  0   AB  0   Living  1     SAB  0   TAB  0   Ectopic  0   Multiple  0   Live Births  1           Social History:  Social History   Socioeconomic History  . Marital status: Single    Spouse name: Not on file  . Number of children: Not on file  . Years of education: Not on file  . Highest education level: Not on file  Occupational History  . Not on file  Social Needs  . Financial resource strain: Not on file  . Food insecurity:    Worry: Not on file    Inability: Not on file  . Transportation needs:    Medical: Not on file    Non-medical: Not on file  Tobacco Use  . Smoking status: Never Smoker  . Smokeless tobacco: Never Used  Substance and Sexual Activity  . Alcohol use: No    Alcohol/week: 0.0 standard drinks  . Drug use: No  . Sexual activity: Yes    Birth control/protection: None  Lifestyle  . Physical activity:    Days per week: Not on file  Minutes per session: Not on file  . Stress: Not on file  Relationships  . Social connections:    Talks on phone: Not on file    Gets together: Not on file    Attends religious service: Not on file    Active member of club or organization: Not on file    Attends meetings of clubs or organizations: Not on file    Relationship status: Not on file  Other Topics Concern  . Not on file  Social History Narrative  . Not on file    Family History:  Family History  Problem Relation Age of Onset  . Heart disease Father        MI  . Diabetes Father   . Birth defects Other        Downs syndrome, cleft lip and palate    Medications:  Prenatal vitamins,  Current Facility-Administered Medications  Medication Dose Route Frequency Provider Last Rate Last Dose  . ceFAZolin (ANCEF) IVPB 2g/100 mL premix  2 g Intravenous On Call to OR Levie HeritageStinson, Mei Suits J, DO      . famotidine (PEPCID) IVPB 20 mg premix  20 mg Intravenous Once Bethena Midgetddono, Ernest, MD 100 mL/hr at 04/13/18 1541 20 mg at 04/13/18 1541   . lactated ringers infusion   Intravenous Continuous Levie HeritageStinson, Akeem Heppler J, DO 125 mL/hr at 04/13/18 1515    . metoCLOPramide (REGLAN) 5 MG/5ML solution 10 mg  10 mg Oral Once Bethena Midgetddono, Ernest, MD        Allergies: No Known Allergies  Review of Systems: - negative  Physical Exam: Blood pressure 101/82, temperature 97.8 F (36.6 C), temperature source Oral, resp. rate 18, height 5' 1.81" (1.57 m), weight 75.8 kg, last menstrual period 07/21/2017. GENERAL: Well-developed, well-nourished female in no acute distress.  LUNGS: Clear to auscultation bilaterally.  HEART: Regular rate and rhythm. ABDOMEN: Soft, nontender, nondistended, gravid. EXTREMITIES: Nontender, no edema, 2+ distal pulses. Cervical Exam: Dilatation 4cm     Pertinent Labs/Studies:   Lab Results  Component Value Date   WBC 7.4 04/13/2018   HGB 11.3 (L) 04/13/2018   HCT 35.3 (L) 04/13/2018   MCV 88.7 04/13/2018   PLT 259 04/13/2018    Assessment : Emily Andrade is a 37 y.o. G2P1001 at 5662w0d being admitted for cesarean section secondary to dilated cervix, breech, marginal/posterior previa.  Plan: The risks of cesarean section discussed with the patient included but were not limited to: bleeding which may require transfusion or reoperation; infection which may require antibiotics; injury to bowel, bladder, ureters or other surrounding organs; injury to the fetus; need for additional procedures including hysterectomy in the event of a life-threatening hemorrhage; placental abnormalities wth subsequent pregnancies, incisional problems, thromboembolic phenomenon and other postoperative/anesthesia complications. The patient concurred with the proposed plan, giving informed written consent for the procedure.   Patient has been NPO since 10:30am and will remain NPO for procedure.  Preoperative prophylactic Ancef ordered on call to the OR.    Levie HeritageStinson, Erice Ahles J, DO 04/13/2018, 3:44 PM

## 2018-04-13 NOTE — Progress Notes (Signed)
Having bloody mucus for a couple of days.  Would like to be check today

## 2018-04-13 NOTE — MAU Note (Signed)
Pt to room 159 per Dr. Adrian BlackwaterStinson.

## 2018-04-13 NOTE — Op Note (Signed)
Emily MerlesDina Martinez Andrade PROCEDURE DATE: 04/13/2018  PREOPERATIVE DIAGNOSIS: Intrauterine pregnancy at  3468w0d weeks gestation; malpresentation: frank breech and placenta previa  POSTOPERATIVE DIAGNOSIS: The same  PROCEDURE: Repeat Low Transverse Cesarean Section  SURGEON:  Dr. Candelaria CelesteJacob Elizabth Palka  ASSISTANT: Dr Jama FlavorsAsmin Peiffer  INDICATIONS: Emily MerlesDina Martinez Andrade is a [age] y.o. [id number] at 7268w0d scheduled for cesarean section secondary to malpresentation: frank breech and placenta previa.  The risks of cesarean section discussed with the patient included but were not limited to: bleeding which may require transfusion or reoperation; infection which may require antibiotics; injury to bowel, bladder, ureters or other surrounding organs; injury to the fetus; need for additional procedures including hysterectomy in the event of a life-threatening hemorrhage; placental abnormalities wth subsequent pregnancies, incisional problems, thromboembolic phenomenon and other postoperative/anesthesia complications. The patient concurred with the proposed plan, giving informed written consent for the procedure.    FINDINGS:  Viable femal infant in frank breech presentation.  Apgars 8 and 9, weight pending.  Clear amniotic fluid.  Intact placenta, three vessel cord.  Normal uterus, fallopian tubes and ovaries bilaterally.  ANESTHESIA:    Spinal INTRAVENOUS FLUIDS:2300 ml QUANTITATIVE BLOOD LOSS: 744 ml URINE OUTPUT:  50 ml SPECIMENS: Placenta sent to L&D COMPLICATIONS: None immediate  PROCEDURE IN DETAIL:  The patient received intravenous antibiotics and had sequential compression devices applied to her lower extremities while in the preoperative area.  She was then taken to the operating room where spinal anesthesia was administered  and was found to be adequate. She was then placed in a dorsal supine position with a leftward tilt, and prepped and draped in a sterile manner.  A foley catheter was placed into her bladder and  attached to constant gravity, which drained clear fluid throughout.  After an adequate timeout was performed, a Pfannenstiel skin incision was made with scalpel and carried through to the underlying layer of fascia. The fascia was incised in the midline and this incision was extended bilaterally using the Mayo scissors. Kocher clamps were applied to the superior aspect of the fascial incision and the underlying rectus muscles were dissected off bluntly. The rectus muscles were separated in the midline bluntly and the peritoneum was entered bluntly. An Alexis retractor was placed to aid in visualization of the uterus.  Attention was turned to the lower uterine segment where a transverse hysterotomy was made with a scalpel and extended bilaterally bluntly. The infant was successfully delivered, and cord was clamped and cut and infant was handed over to awaiting neonatology team. Uterine massage was then administered and the placenta delivered intact with three-vessel cord. The uterus was then cleared of clot and debris.  The hysterotomy was closed with 0 Vicryl in a running locked fashion, and an imbricating layer was also placed with a 0 Vicryl. Overall, excellent hemostasis was noted. The abdomen and the pelvis were cleared of all clot and debris and the Jon Gillslexis was removed. Hemostasis was confirmed on all surfaces.  The peritoneum was reapproximated using 2-0 vicryl running stitches. The fascia was then closed using 0 Vicryl in a running fashion. The skin was closed with 4-0 vicryl. The patient tolerated the procedure well. Sponge, lap, instrument and needle counts were correct x 2. She was taken to the recovery room in stable condition.    Emily Andrade, Emily Ailey J, DO 04/13/2018 5:47 PM

## 2018-04-13 NOTE — Patient Instructions (Signed)
 Lactancia materna Breastfeeding Decidir amamantar es una de las mejores elecciones que puede hacer por usted y su beb. Un cambio en las hormonas durante el embarazo hace que las mamas produzcan leche materna en las glndulas productoras de leche. Las hormonas impiden que la leche materna sea liberada antes del nacimiento del beb. Adems, impulsan el flujo de leche luego del nacimiento. Una vez que ha comenzado a amamantar, pensar en el beb, as como la succin o el llanto, pueden estimular la liberacin de leche de las glndulas productoras de leche. Los beneficios de amamantar Las investigaciones demuestran que la lactancia materna ofrece muchos beneficios de salud para bebs y madres. Adems, ofrece una forma gratuita y conveniente de alimentar al beb. Para el beb  La primera leche (calostro) ayuda a mejorar el funcionamiento del aparato digestivo del beb.  Las clulas especiales de la leche (anticuerpos) ayudan a combatir las infecciones en el beb.  Los bebs que se alimentan con leche materna tambin tienen menos probabilidades de tener asma, alergias, obesidad o diabetes de tipo 2. Adems, tienen menor riesgo de sufrir el sndrome de muerte sbita del lactante (SMSL).  Los nutrientes de la leche materna son mejores para satisfacer las necesidades del beb en comparacin con la leche maternizada.  La leche materna mejora el desarrollo cerebral del beb. Para usted  La lactancia materna favorece el desarrollo de un vnculo muy especial entre la madre y el beb.  Es conveniente. La leche materna es econmica y siempre est disponible a la temperatura correcta.  La lactancia materna ayuda a quemar caloras. Le ayuda a perder el peso ganado durante el embarazo.  Hace que el tero vuelva al tamao que tena antes del embarazo ms rpido. Adems, disminuye el sangrado (loquios) despus del parto.  La lactancia materna contribuye a reducir el riesgo de tener diabetes de tipo 2,  osteoporosis, artritis reumatoide, enfermedades cardiovasculares y cncer de mama, ovario, tero y endometrio en el futuro. Informacin bsica sobre la lactancia Comienzo de la lactancia  Encuentre un lugar cmodo para sentarse o acostarse, con un buen respaldo para el cuello y la espalda.  Coloque una almohada o una manta enrollada debajo del beb para acomodarlo a la altura de la mama (si est sentada). Las almohadas para amamantar se han diseado especialmente a fin de servir de apoyo para los brazos y el beb mientras amamanta.  Asegrese de que la barriga del beb (abdomen) est frente a la suya.  Masajee suavemente la mama. Con las yemas de los dedos, masajee los bordes exteriores de la mama hacia adentro, en direccin al pezn. Esto estimula el flujo de leche. Si la leche fluye lentamente, es posible que deba continuar con este movimiento durante la lactancia.  Sostenga la mama con 4 dedos por debajo y el pulgar por arriba del pezn (forme la letra "C" con la mano). Asegrese de que los dedos se encuentren lejos del pezn y de la boca del beb.  Empuje suavemente los labios del beb con el pezn o con el dedo.  Cuando la boca del beb se abra lo suficiente, acrquelo rpidamente a la mama e introduzca todo el pezn y la arola, tanto como sea posible, dentro de la boca del beb. La arola es la zona de color que rodea al pezn. ? Debe haber ms arola visible por arriba del labio superior del beb que por debajo del labio inferior. ? Los labios del beb deben estar abiertos y extendidos hacia afuera (evertidos) para asegurar que   el beb se prenda de forma adecuada y cmoda. ? La lengua del beb debe estar entre la enca inferior y la mama.  Asegrese de que la boca del beb est en la posicin correcta alrededor del pezn (prendido). Los labios del beb deben crear un sello sobre la mama y estar doblados hacia afuera (invertidos).  Es comn que el beb succione durante 2 a 3 minutos  para que comience el flujo de leche materna. Cmo debe prenderse Es muy importante que le ensee al beb cmo prenderse adecuadamente a la mama. Si el beb no se prende adecuadamente, puede causar dolor en los pezones, reducir la produccin de leche materna y hacer que el beb tenga un escaso aumento de peso. Adems, si el beb no se prende adecuadamente al pezn, puede tragar aire durante la alimentacin. Esto puede causarle molestias al beb. Hacer eructar al beb al cambiar de mama puede ayudarlo a liberar el aire. Sin embargo, ensearle al beb cmo prenderse a la mama adecuadamente es la mejor manera de evitar que se sienta molesto por tragar aire mientras se alimenta. Signos de que el beb se ha prendido adecuadamente al pezn  Tironea o succiona de modo silencioso, sin causarle dolor. Los labios del beb deben estar extendidos hacia afuera (evertidos).  Se escucha que traga cada 3 o 4 succiones una vez que la leche ha comenzado a fluir (despus de que se produzca el reflejo de eyeccin de la leche).  Hay movimientos musculares por arriba y por delante de sus odos al succionar.  Signos de que el beb no se ha prendido adecuadamente al pezn  Hace ruidos de succin o de chasquido mientras se alimenta.  Siente dolor en los pezones.  Si cree que el beb no se prendi correctamente, deslice el dedo en la comisura de la boca y colquelo entre las encas del beb para interrumpir la succin. Intente volver a comenzar a amamantar. Signos de lactancia materna exitosa Signos del beb  El beb disminuir gradualmente el nmero de succiones o dejar de succionar por completo.  El beb se quedar dormido.  El cuerpo del beb se relajar.  El beb retendr una pequea cantidad de leche en la boca.  El beb se desprender solo del pecho.  Signos que presenta usted  Las mamas han aumentado la firmeza, el peso y el tamao 1 a 3 horas despus de amamantar.  Estn ms blandas inmediatamente  despus de amamantar.  Se producen un aumento del volumen de leche y un cambio en su consistencia y color hacia el quinto da de lactancia.  Los pezones no duelen, no estn agrietados ni sangran.  Signos de que su beb recibe la cantidad de leche suficiente  Mojar por lo menos 1 o 2paales durante las primeras 24horas despus del nacimiento.  Mojar por lo menos 5 o 6paales cada 24horas durante la primera semana despus del nacimiento. La orina debe ser clara o de color amarillo plido a los 5das de vida.  Mojar entre 6 y 8paales cada 24horas a medida que el beb sigue creciendo y desarrollndose.  Defeca por lo menos 3 veces en 24 horas a los 5 das de vida. Las heces deben ser blandas y amarillentas.  Defeca por lo menos 3 veces en 24 horas a los 7 das de vida. Las heces deben ser grumosas y amarillentas.  No registra una prdida de peso mayor al 10% del peso al nacer durante los primeros 3 das de vida.  Aumenta de peso un   promedio de 4 a 7onzas (113 a 198g) por semana despus de los 4 das de vida.  Aumenta de peso, diariamente, de manera uniforme a partir de los 5 das de vida, sin registrar prdida de peso despus de las 2semanas de vida. Despus de alimentarse, es posible que el beb regurgite una pequea cantidad de leche. Esto es normal. Frecuencia y duracin de la lactancia El amamantamiento frecuente la ayudar a producir ms leche y puede prevenir dolores en los pezones y las mamas extremadamente llenas (congestin mamaria). Alimente al beb cuando muestre signos de hambre o si siente la necesidad de reducir la congestin de las mamas. Esto se denomina "lactancia a demanda". Las seales de que el beb tiene hambre incluyen las siguientes:  Aumento del estado de alerta, actividad o inquietud.  Mueve la cabeza de un lado a otro.  Abre la boca cuando se le toca la mejilla o la comisura de la boca (reflejo de bsqueda).  Aumenta las vocalizaciones, tales como  sonidos de succin, se relame los labios, emite arrullos, suspiros o chirridos.  Mueve la mano hacia la boca y se chupa los dedos o las manos.  Est molesto o llora.  Evite el uso del chupete en las primeras 4 a 6 semanas despus del nacimiento del beb. Despus de este perodo, podr usar un chupete. Las investigaciones demostraron que el uso del chupete durante el primer ao de vida del beb disminuye el riesgo de tener el sndrome de muerte sbita del lactante (SMSL). Permita que el nio se alimente en cada mama todo lo que desee. Cuando el beb se desprende o se queda dormido mientras se est alimentando de la primera mama, ofrzcale la segunda. Debido a que, con frecuencia, los recin nacidos estn somnolientos las primeras semanas de vida, es posible que deba despertar al beb para alimentarlo. Los horarios de lactancia varan de un beb a otro. Sin embargo, las siguientes reglas pueden servir como gua para ayudarla a garantizar que el beb se alimenta adecuadamente:  Se puede amamantar a los recin nacidos (bebs de 4 semanas o menos de vida) cada 1 a 3 horas.  No deben transcurrir ms de 3 horas durante el da o 5 horas durante la noche sin que se amamante a los recin nacidos.  Debe amamantar al beb un mnimo de 8 veces en un perodo de 24 horas.  Extraccin de leche materna La extraccin y el almacenamiento de la leche materna le permiten asegurarse de que el beb se alimente exclusivamente de su leche materna, aun en momentos en los que no puede amamantar. Esto tiene especial importancia si debe regresar al trabajo en el perodo en que an est amamantando o si no puede estar presente en los momentos en que el beb debe alimentarse. Su asesor en lactancia puede ayudarla a encontrar un mtodo de extraccin que funcione mejor para usted y orientarla sobre cunto tiempo es seguro almacenar leche materna. Cmo cuidar las mamas durante la lactancia Los pezones pueden secarse, agrietarse y  doler durante la lactancia. Las siguientes recomendaciones pueden ayudarla a mantener las mamas humectadas y sanas:  Evite usar jabn en los pezones.  Use un sostn de soporte diseado especialmente para la lactancia materna. Evite usar sostenes con aro o sostenes muy ajustados (sostenes deportivos).  Seque al aire sus pezones durante 3 a 4minutos despus de amamantar al beb.  Utilice solo apsitos de algodn en el sostn para absorber las prdidas de leche. La prdida de un poco de leche materna   entre las tomas es normal.  Utilice lanolina sobre los pezones luego de amamantar. La lanolina ayuda a mantener la humedad normal de la piel. La lanolina pura no es perjudicial (no es txica) para el beb. Adems, puede extraer manualmente algunas gotas de leche materna y masajear suavemente esa leche sobre los pezones para que la leche se seque al aire.  Durante las primeras semanas despus del nacimiento, algunas mujeres experimentan congestin mamaria. La congestin mamaria puede hacer que sienta las mamas pesadas, calientes y sensibles al tacto. El pico de la congestin mamaria ocurre en el plazo de los 3 a 5 das despus del parto. Las siguientes recomendaciones pueden ayudarla a aliviar la congestin mamaria:  Vace por completo las mamas al amamantar o extraer leche. Puede aplicar calor hmedo en las mamas (en la ducha o con toallas hmedas para manos) antes de amamantar o extraer leche. Esto aumenta la circulacin y ayuda a que la leche fluya. Si el beb no vaca por completo las mamas cuando lo amamanta, extraiga la leche restante despus de que haya finalizado.  Aplique compresas de hielo sobre las mamas inmediatamente despus de amamantar o extraer leche, a menos que le resulte demasiado incmodo. Haga lo siguiente: ? Ponga el hielo en una bolsa plstica. ? Coloque una toalla entre la piel y la bolsa de hielo. ? Coloque el hielo durante 20minutos, 2 o 3veces por da.  Asegrese de que el  beb est prendido y se encuentre en la posicin correcta mientras lo alimenta.  Si la congestin mamaria persiste luego de 48 horas o despus de seguir estas recomendaciones, comunquese con su mdico o un asesor en lactancia. Recomendaciones de salud general durante la lactancia  Consuma 3 comidas y 3 colaciones saludables todos los das. Las madres bien alimentadas que amamantan necesitan entre 450 y 500 caloras adicionales por da. Puede cumplir con este requisito al aumentar la cantidad de una dieta equilibrada que realice.  Beba suficiente agua para mantener la orina clara o de color amarillo plido.  Descanse con frecuencia, reljese y siga tomando sus vitaminas prenatales para prevenir la fatiga, el estrs y los niveles bajos de vitaminas y minerales en el cuerpo (deficiencias de nutrientes).  No consuma ningn producto que contenga nicotina o tabaco, como cigarrillos y cigarrillos electrnicos. El beb puede verse afectado por las sustancias qumicas de los cigarrillos que pasan a la leche materna y por la exposicin al humo ambiental del tabaco. Si necesita ayuda para dejar de fumar, consulte al mdico.  Evite el consumo de alcohol.  No consuma drogas ilegales o marihuana.  Antes de usar cualquier medicamento, hable con el mdico. Estos incluyen medicamentos recetados y de venta libre, como tambin vitaminas y suplementos a base de hierbas. Algunos medicamentos, que pueden ser perjudiciales para el beb, pueden pasar a travs de la leche materna.  Puede quedar embarazada durante la lactancia. Si se desea un mtodo anticonceptivo, consulte al mdico sobre cules son las opciones seguras durante la lactancia. Dnde encontrar ms informacin: Liga internacional La Leche: www.llli.org. Comunquese con un mdico si:  Siente que quiere dejar de amamantar o se siente frustrada con la lactancia.  Sus pezones estn agrietados o sangran.  Sus mamas estn irritadas, sensibles o  calientes.  Tiene los siguientes sntomas: ? Dolor en las mamas o en los pezones. ? Un rea hinchada en cualquiera de las mamas. ? Fiebre o escalofros. ? Nuseas o vmitos. ? Drenaje de otro lquido distinto de la leche materna desde los pezones.    Sus mamas no se llenan antes de amamantar al beb para el quinto da despus del parto.  Se siente triste y deprimida.  El beb: ? Est demasiado somnoliento como para comer bien. ? Tiene problemas para dormir. ? Tiene ms de 1 semana de vida y moja menos de 6 paales en un periodo de 24 horas. ? No ha aumentado de peso a los 5 das de vida.  El beb defeca menos de 3 veces en 24 horas.  La piel del beb o las partes blancas de los ojos se vuelven amarillentas. Solicite ayuda de inmediato si:  El beb est muy cansado (letargo) y no se quiere despertar para comer.  Le sube la fiebre sin causa. Resumen  La lactancia materna ofrece muchos beneficios de salud para bebs y madres.  Intente amamantar a su beb cuando muestre signos tempranos de hambre.  Haga cosquillas o empuje suavemente los labios del beb con el dedo o el pezn para lograr que el beb abra la boca. Acerque el beb a la mama. Asegrese de que la mayor parte de la arola se encuentre dentro de la boca del beb. Ofrzcale una mama y haga eructar al beb antes de pasar a la otra.  Hable con su mdico o asesor en lactancia si tiene dudas o problemas con la lactancia. Esta informacin no tiene como fin reemplazar el consejo del mdico. Asegrese de hacerle al mdico cualquier pregunta que tenga. Document Released: 04/21/2005 Document Revised: 08/11/2016 Document Reviewed: 08/11/2016 Elsevier Interactive Patient Education  2018 Elsevier Inc.  

## 2018-04-13 NOTE — Progress Notes (Signed)
    PRENATAL VISIT NOTE  Subjective:  Emily Andrade is a 37 y.o. G2P1001 at 4142w0d being seen today for ongoing prenatal care.  She is currently monitored for the following issues for this high-risk pregnancy and has Supervision of high risk pregnancy, antepartum; AMA (advanced maternal age) multigravida 35+; Previous cesarean delivery affecting pregnancy, antepartum; Depression; Asthma; and Language barrier on their problem list.  Patient reports no complaints. Having spotting since last check. Contractions: Irregular. Vag. Bleeding: Bloody Show.  Movement: Present. Denies leaking of fluid.   The following portions of the patient's history were reviewed and updated as appropriate: allergies, current medications, past family history, past medical history, past social history, past surgical history and problem list. Problem list updated.  Objective:   Vitals:   04/13/18 0839  BP: 104/70  Pulse: 70  Weight: 169 lb 9.6 oz (76.9 kg)    Fetal Status: Fetal Heart Rate (bpm): 133 Fundal Height: 36 cm Movement: Present  Presentation: Complete Breech  General:  Alert, oriented and cooperative. Patient is in no acute distress.  Skin: Skin is warm and dry. No rash noted.   Cardiovascular: Normal heart rate noted  Respiratory: Normal respiratory effort, no problems with respiration noted  Abdomen: Soft, gravid, appropriate for gestational age.  Pain/Pressure: Present     Pelvic: Cervical exam performed Dilation: 4.5 Effacement (%): 70 Station: Ballotable  Extremities: Normal range of motion.  Edema: Trace  Mental Status: Normal mood and affect. Normal behavior. Normal judgment and thought content.  Limited OB u/s reveals breech presentation, ? Placenta low Assessment and Plan:  Pregnancy: G2P1001 at 5942w0d  1. Supervision of high risk pregnancy, antepartum On exam can feel something in cervix, ? Placental location. [facility] at [facility]. Breech today--offered ECV if no placenta in the way--if  not needs RCS - US MFM OB LIMITED  2. Previous cesarean delivery affecting pregnancy, antepartum Wants TOLAC--but will depend on u/s  Term labor symptoms and general obstetric precautions including but not limited to vaginal bleeding, contractions, leaking of fluid and fetal movement were reviewed in detail with the patient. Please refer to After Visit Summary for other counseling recommendations.  Return in 1 week (on 04/20/2018).  Future Appointments  Date Time Provider Department Center  04/13/2018  1:30 PM [facility] US 1 [facility]  04/21/2018 11:30 AM Federico FlakeNewton, Kimberly Niles, MD [facility]    Reva Boresanya S Sharry Beining, MD

## 2018-04-13 NOTE — Anesthesia Preprocedure Evaluation (Signed)
Anesthesia Evaluation  Patient identified by MRN, date of birth, ID band Patient awake    Reviewed: Allergy & Precautions, H&P , NPO status , Patient's Chart, lab work & pertinent test results, reviewed documented beta blocker date and time   Airway Mallampati: II  TM Distance: >3 FB Neck ROM: full    Dental no notable dental hx.    Pulmonary neg pulmonary ROS,    Pulmonary exam normal breath sounds clear to auscultation       Cardiovascular negative cardio ROS Normal cardiovascular exam Rhythm:regular Rate:Normal     Neuro/Psych negative neurological ROS  negative psych ROS   GI/Hepatic negative GI ROS, Neg liver ROS,   Endo/Other  negative endocrine ROS  Renal/GU negative Renal ROS  negative genitourinary   Musculoskeletal   Abdominal   Peds  Hematology negative hematology ROS (+) Blood dyscrasia, anemia ,   Anesthesia Other Findings   Reproductive/Obstetrics (+) Pregnancy                             Anesthesia Physical Anesthesia Plan  ASA: II and emergent  Anesthesia Plan: Spinal   Post-op Pain Management:    Induction:   PONV Risk Score and Plan: Treatment may vary due to age or medical condition and Scopolamine patch - Pre-op  Airway Management Planned: Nasal Cannula  Additional Equipment:   Intra-op Plan:   Post-operative Plan:   Informed Consent: I have reviewed the patients History and Physical, chart, labs and discussed the procedure including the risks, benefits and alternatives for the proposed anesthesia with the patient or authorized representative who has indicated his/her understanding and acceptance.     Plan Discussed with: CRNA, Anesthesiologist and Surgeon  Anesthesia Plan Comments: (  )        Anesthesia Quick Evaluation

## 2018-04-13 NOTE — Anesthesia Procedure Notes (Addendum)
Spinal  Patient location during procedure: OR Start time: 04/13/2018 4:40 PM End time: 04/13/2018 4:45 PM Staffing Anesthesiologist: Bethena Midgetddono, Ernest, MD Performed: other anesthesia staff  Preanesthetic Checklist Completed: patient identified, site marked, surgical consent, pre-op evaluation, timeout performed, IV checked, risks and benefits discussed and monitors and equipment checked Spinal Block Patient position: sitting Prep: ChloraPrep Patient monitoring: heart rate, cardiac monitor, continuous pulse ox and blood pressure Approach: midline Location: L3-4 Injection technique: single-shot Needle Needle type: Pencan  Needle gauge: 25 G Needle length: 5 cm

## 2018-04-14 ENCOUNTER — Encounter (HOSPITAL_COMMUNITY): Payer: Self-pay | Admitting: Family Medicine

## 2018-04-14 LAB — CBC
HCT: 26.6 % — ABNORMAL LOW (ref 36.0–46.0)
HEMOGLOBIN: 8.8 g/dL — AB (ref 12.0–15.0)
MCH: 28.9 pg (ref 26.0–34.0)
MCHC: 33.1 g/dL (ref 30.0–36.0)
MCV: 87.2 fL (ref 80.0–100.0)
Platelets: 200 10*3/uL (ref 150–400)
RBC: 3.05 MIL/uL — ABNORMAL LOW (ref 3.87–5.11)
RDW: 13.6 % (ref 11.5–15.5)
WBC: 11.1 10*3/uL — ABNORMAL HIGH (ref 4.0–10.5)
nRBC: 0 % (ref 0.0–0.2)

## 2018-04-14 LAB — BASIC METABOLIC PANEL
Anion gap: 9 (ref 5–15)
BUN: 6 mg/dL (ref 6–20)
CO2: 19 mmol/L — ABNORMAL LOW (ref 22–32)
Calcium: 7.9 mg/dL — ABNORMAL LOW (ref 8.9–10.3)
Chloride: 102 mmol/L (ref 98–111)
Creatinine, Ser: 0.7 mg/dL (ref 0.44–1.00)
GFR calc Af Amer: >60 mL/min (ref >60)
GFR calc non Af Amer: >60 mL/min (ref >60)
Glucose, Bld: 121 mg/dL — ABNORMAL HIGH (ref 70–99)
Potassium: 3.6 mmol/L (ref 3.5–5.1)
Sodium: 130 mmol/L — ABNORMAL LOW (ref 135–145)

## 2018-04-14 LAB — RPR: RPR Ser Ql: NONREACTIVE

## 2018-04-14 MED ORDER — LACTATED RINGERS IV BOLUS: 1000.0000 mL | Freq: Once | INTRAVENOUS | Status: DC

## 2018-04-14 MED ORDER — LACTATED RINGERS IV BOLUS
1000.0000 mL | Freq: Once | INTRAVENOUS | Status: AC
  Administered 2018-04-14: 1000 mL via INTRAVENOUS

## 2018-04-14 MED ORDER — FERROUS SULFATE 325 (65 FE) MG PO TABS
325.0000 mg | ORAL_TABLET | Freq: Two times a day (BID) | ORAL | Status: DC
  Administered 2018-04-14 – 2018-04-15 (×2): 325 mg via ORAL
  Filled 2018-04-14 (×2): qty 1

## 2018-04-14 NOTE — Anesthesia Postprocedure Evaluation (Signed)
Anesthesia Post Note  Patient: Emily Andrade  Procedure(s) Performed: CESAREAN SECTION (N/A )     Patient location during evaluation: Mother Baby Anesthesia Type: Spinal Level of consciousness: awake, awake and alert and oriented Pain management: pain level controlled Vital Signs Assessment: post-procedure vital signs reviewed and stable Respiratory status: spontaneous breathing and respiratory function stable Cardiovascular status: blood pressure returned to baseline Postop Assessment: no headache, epidural receding, patient able to bend at knees, adequate PO intake, no backache, no apparent nausea or vomiting and able to ambulate Anesthetic complications: no    Last Vitals:  Vitals:   04/14/18 0500 04/14/18 0745  BP: 97/63 96/65  Pulse: 93 66  Resp:  18  Temp:  36.5 C  SpO2:      Last Pain:  Vitals:   04/14/18 0745  TempSrc: Oral  PainSc: 0-No pain   Pain Goal: Patients Stated Pain Goal: 4 (04/13/18 1512)               Cleda ClarksBrowder, Lesly Joslyn R

## 2018-04-14 NOTE — Progress Notes (Signed)
POSTPARTUM PROGRESS NOTE  Post Partum Day 1  Subjective:  Emily Andrade is a 37 y.o. [id number] s/p RLTCS for breech at 7429w0d.  She reports she is doing well. No acute events overnight. She denies any problems with po intake. Denies nausea or vomiting. Has not had BM or flatus.  Pain is well controlled.  Lochia is mild, decreased.  Objective: Blood pressure 96/65, pulse 66, temperature 97.7 F (36.5 C), temperature source Oral, resp. rate 18, height 5' 1.81" (1.57 m), weight 75.8 kg, last menstrual period 07/21/2017, SpO2 100 %, unknown if currently breastfeeding.  Physical Exam:  General: alert, cooperative and no distress Chest: no respiratory distress Heart:regular rate Abdomen: soft, non-tender  dressing is C/D/I (pressure dressing still in place) Uterine Fundus: firm, appropriately tender DVT Evaluation: No calf swelling or tenderness Extremities: No edema Skin: warm, dry  Recent Labs    04/13/18 1525 04/14/18 0535  HGB 11.3* 8.8*  HCT 35.3* 26.6*    Assessment/Plan: Emily Andrade is a 37 y.o. [id number] s/p VBAC at 3229w0d   PPD#1: Routine postpartum care  Anemia: Hgb 8.8 this morning, down from 11.3 yesterday.  - Oral ferrous sulfate 325 mg BID  Urinary Retention: Overnight, she has decreased urine output (100 mL/4hrs) and received 1L fluid bolus. This morning, she continued to have low output (100 mL/5 hrs).  - 1L LR bolus - BMP - Encouraging oral fluid intake - Strict intake/output, foley in place  Contraception: Vasectomy  Feeding: Breast/bottle  Dispo: Plan for possible discharge tomorrow if improved urine output and urinating well without foley.   LOS: 1 day   Trenda Mootslizabeth Barefoot MS3 04/14/2018, 9:38 AM   Attestation: I have seen this patient and agree with the medical student's documentation. I have examined them separately, and we have discussed the plan of care.  Cristal DeerLaurel S. Earlene PlaterWallace, DO OB/GYN Fellow

## 2018-04-14 NOTE — Lactation Note (Signed)
This note was copied from a baby's chart. Lactation Consultation Note  Patient Name: Emily Andrade Today's Date: 04/14/2018 Reason for consult: Initial assessment;Early term 37-38.6wks P2, 8 hour female infant. [employer] interpreter  used GoreLuis [phone] Per parents infant had 3 stools and 1 void since delivery. LC entered room Dad had given  infant 5 ml of formula. Per mom, she thinks she doesn't have milk. Per mom, she breastfeed eldest child for 13 months. [facility] [place] asked  mom to hand express and colostrum present both breast. Mom latched infant on right breast using football hold, infant latched with wide gape and actively suckling at breast. Mom was breastfeeding for 8 minutes when LC left room. Mom will BF according hunger cues, 8 to 12 times within 24 hours and not exceed 3 hours without breastfeeding. LC discussed I & O. Mom plans to breastfeed first then supplement with formula. Mom made aware of O/P services, breastfeeding support groups, community resources, and our phone # for post-discharge questions.   Maternal Data Formula Feeding for Exclusion: No Has patient been taught Hand Expression?: Yes(Mom easily expressed colostrum .) Does the patient have breastfeeding experience prior to this delivery?: Yes  Feeding Feeding Type: Bottle Fed - Formula Nipple Type: Slow - flow  LATCH Score Latch: Grasps breast easily, tongue down, lips flanged, rhythmical sucking.  Audible Swallowing: Spontaneous and intermittent  Type of Nipple: Everted at rest and after stimulation  Comfort (Breast/Nipple): Soft / non-tender  Hold (Positioning): Assistance needed to correctly position infant at breast and maintain latch.  LATCH Score: 9  Interventions Interventions: Breast feeding basics reviewed;Assisted with latch;Adjust position;Skin to skin;Support pillows;Hand express  Lactation Tools Discussed/Used WIC Program:  Yes   Consult Status      Emily Andrade 04/14/2018, 1:11 AM

## 2018-04-14 NOTE — Discharge Summary (Addendum)
Postpartum Discharge Summary     Patient Name: Emily Andrade DOB: Jul 25, 1980 MRN: [id number]  Date of admission: 04/13/2018 Delivering Provider: Levie HeritageSTINSON, JACOB J   Date of discharge: 04/15/2018  Admitting diagnosis: 38WKS, breeched Intrauterine pregnancy: 6625w0d     Secondary diagnosis:  Active Problems:   Status post cesarean delivery  Additional problems: None     Discharge diagnosis: Term Pregnancy Delivered                                                                                                Post partum procedures:None  Augmentation: None  Complications: None  Hospital course:  Sceduled C/S   37 y.o. yo G2P2002 at 2225w0d was admitted to the hospital 04/13/2018 for scheduled cesarean section with the following indication:Malpresentation and Previa.  Membrane Rupture Time/Date: 5:08 PM ,04/13/2018   Patient delivered a Viable infant.04/13/2018  Details of operation can be found in separate operative note.  Pateint had an uncomplicated postpartum course.  She is ambulating, tolerating a regular diet, passing flatus, and urinating well. Patient is discharged home in stable condition on  04/15/18         Magnesium Sulfate recieved: No BMZ received: No  Physical exam  Vitals:   04/14/18 1115 04/14/18 1515 04/14/18 2300 04/15/18 0534  BP: 97/62 95/70 96/61  (!) 101/50  Pulse: 85 78 88 70  Resp: 17 18 19 16   Temp: 98 F (36.7 C) 98 F (36.7 C) 97.7 F (36.5 C) 97.7 F (36.5 C)  TempSrc: Oral  Oral Oral  SpO2: 98% 96%    Weight:      Height:       General: alert, cooperative and no distress Feeling well. Walking in room without signs of fatigue, pallor, or dizziness. Lochia: appropriate Uterine Fundus: firm Incision: Healing well with no significant drainage, No significant erythema, Dressing is clean, dry, and intact DVT Evaluation: No evidence of DVT seen on physical exam. Negative Homan's sign. No cords or calf tenderness. No significant  calf/ankle edema. Labs: Lab Results  Component Value Date   WBC 11.1 (H) 04/14/2018   HGB 8.8 (L) 04/14/2018   HCT 26.6 (L) 04/14/2018   MCV 87.2 04/14/2018   PLT 200 04/14/2018   CMP Latest Ref Rng & Units 04/14/2018  Glucose 70 - 99 mg/dL 119(J121(H)  BUN 6 - 20 mg/dL 6  Creatinine 4.780.44 - 2.951.00 mg/dL 6.210.70  Sodium 308135 - 657145 mmol/L 130(L)  Potassium 3.5 - 5.1 mmol/L 3.6  Chloride 98 - 111 mmol/L 102  CO2 22 - 32 mmol/L 19(L)  Calcium 8.9 - 10.3 mg/dL 7.9(L)    Discharge instruction: per After Visit Summary and "Baby and Me Booklet".  After visit meds:  Allergies as of 04/15/2018   No Known Allergies     Medication List    STOP taking these medications   aspirin EC 81 MG tablet   cyclobenzaprine 10 MG tablet Commonly known as:  FLEXERIL     TAKE these medications   ibuprofen 600 MG tablet Commonly known as:  ADVIL,MOTRIN Take 1 tablet (600 mg total) by mouth every  6 (six) hours.   multivitamin-prenatal 27-0.8 MG Tabs tablet Take 1 tablet by mouth daily at 12 noon.   oxyCODONE-acetaminophen 5-325 MG tablet Commonly known as:  PERCOCET Take 1 tablet by mouth every 4 (four) hours as needed for severe pain.   senna-docusate 8.6-50 MG tablet Commonly known as:  Senokot-S Take 2 tablets by mouth daily for 7 days. Start taking on:  April 16, 2018       Diet: routine diet  Activity: Advance as tolerated. Pelvic rest for 6 weeks.   Outpatient follow up:4 weeks Follow up Appt: Future Appointments  Date Time Provider Department Center  04/21/2018 11:30 AM Federico Flake, MD [facility]   Follow up Visit:   Please schedule this patient for Postpartum visit in: 4 weeks with the following provider: Any provider For C/S patients schedule nurse incision check in weeks 2 weeks: yes Low risk pregnancy complicated by: previa and malpresentation Delivery mode:  CS Anticipated Birth Control:  Vasectomy PP Procedures needed: Incision check  Schedule  Integrated BH visit: no  Newborn Data: Live born female  Birth Weight: 7 lb 9.5 oz (3445 g) APGAR: 8, 9  Newborn Delivery   Birth date/time:  04/13/2018 17:10:00 Delivery type:  C-Section, Low Transverse Trial of labor:  No C-section categorization:  Repeat     Baby Feeding: Breast Disposition:home with mother   04/15/2018 Awilda Metro, MD  I confirm that I have verified the information documented in the resident's note and that I have also personally reperformed the physical exam and all medical decision making activities. Patient was seen and examined by me also Agree with note Vitals stable Labs stable Fundus firm, lochia within normal limits Perineum healing Ext WNL Continue care Ready for discharge  Aviva Signs, CNM

## 2018-04-14 NOTE — Progress Notes (Signed)
MOB was referred for history of depression. * Referral screened out by Clinical Social Worker because none of the following criteria appear to apply: ~ History of depression during this pregnancy, or of post-partum depression following prior delivery. 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 (Per chart review, Depression dates back to 2012) OR * MOB's symptoms currently being treated with medication and/or therapy. Please contact the Clinical Social Worker if needs arise, by MOB request, or if MOB scores greater than 9/yes to question 10 on Edinburgh Postpartum Depression Screen.  Katai Marsico, LCSWA Clinical Social Worker [facility] Cell#: [phone]  

## 2018-04-14 NOTE — Progress Notes (Signed)
Pt output over 4 hours 100 mL. RN called physician on call. Physician ordered fluid bolus for patient. Fluid bolus begun and patient informed of why. Patient stated understanding.

## 2018-04-15 MED ORDER — OXYCODONE-ACETAMINOPHEN 5-325 MG PO TABS: 1.0000 | ORAL_TABLET | ORAL | 0 refills | Status: DC | PRN

## 2018-04-15 MED ORDER — OXYCODONE-ACETAMINOPHEN 5-325 MG PO TABS: 1.0000 | ORAL_TABLET | ORAL | 0 refills | Status: AC | PRN

## 2018-04-15 MED ORDER — SENNOSIDES-DOCUSATE SODIUM 8.6-50 MG PO TABS: 2.0000 | ORAL_TABLET | ORAL | 0 refills | Status: AC

## 2018-04-15 MED ORDER — SENNOSIDES-DOCUSATE SODIUM 8.6-50 MG PO TABS: 2.0000 | ORAL_TABLET | ORAL | 0 refills | Status: DC

## 2018-04-15 MED ORDER — IBUPROFEN 600 MG PO TABS: 600.0000 mg | ORAL_TABLET | Freq: Four times a day (QID) | ORAL | 0 refills | Status: DC

## 2018-04-20 ENCOUNTER — Ambulatory Visit: Payer: Self-pay

## 2018-04-21 ENCOUNTER — Encounter: Payer: Self-pay | Admitting: Family Medicine

## 2018-04-21 ENCOUNTER — Ambulatory Visit (INDEPENDENT_AMBULATORY_CARE_PROVIDER_SITE_OTHER): Payer: Self-pay

## 2018-04-21 VITALS — BP 118/74

## 2018-04-21 DIAGNOSIS — Z5189 Encounter for other specified aftercare: Secondary | ICD-10-CM

## 2018-04-21 NOTE — Progress Notes (Signed)
Subjective:     Emily Andrade is a 37 y.o. female who presents to the clinic  weeks status post op c-section  for surgery on 04/13/2018. Eating a regular diet without difficulty. Bowel movements are normal at this time.Patient complains of some discomfort at site otherwise doing ok.    Review of Systems  Objective:    BP 118/74   LMP 07/21/2017  General:   normal at this time  Abdomen: {post op abd exam:soft bowel sounds active non-tender  Incision:   {incision:no swelling, no drainage or odor at this time.     Assessment:    Doing well at this time   Plan:    1. Continue any current medications. 2. Wound care discussed. 3. Activity restrictions: not discuss 4. Anticipated return to work: Not working at this time. 5. Follow up: Postpartum check up   Demetrice Emeline DarlingA Graham, CMA

## 2018-04-21 NOTE — Progress Notes (Signed)
Attestation of Attending Supervision of clinical support staff: I agree with the care provided to this patient and was available for any consultation.  I have reviewed the staff's note and chart, and I agree with the management and plan.  Federico FlakeKimberly Niles Bernadetta Roell, MD, MPH, ABFM Attending Physician Faculty Practice- [facility]

## 2018-04-22 ENCOUNTER — Ambulatory Visit: Payer: Self-pay

## 2018-05-11 ENCOUNTER — Ambulatory Visit (INDEPENDENT_AMBULATORY_CARE_PROVIDER_SITE_OTHER): Payer: Self-pay | Admitting: Obstetrics and Gynecology

## 2018-05-11 ENCOUNTER — Encounter: Payer: Self-pay | Admitting: Obstetrics and Gynecology

## 2018-05-11 DIAGNOSIS — R300 Dysuria: Secondary | ICD-10-CM

## 2018-05-11 DIAGNOSIS — N939 Abnormal uterine and vaginal bleeding, unspecified: Secondary | ICD-10-CM | POA: Insufficient documentation

## 2018-05-11 DIAGNOSIS — Z1151 Encounter for screening for human papillomavirus (HPV): Secondary | ICD-10-CM

## 2018-05-11 DIAGNOSIS — Z124 Encounter for screening for malignant neoplasm of cervix: Secondary | ICD-10-CM

## 2018-05-11 NOTE — Progress Notes (Signed)
Obstetrics and Gynecology Postpartum Visit  Appointment Date: 05/11/2018  OBGYN Clinic: [facility] for [facility]  Primary Care Provider: Patient, No Pcp Per  Chief Complaint:  Chief Complaint  Patient presents with  . Postpartum Care    History of Present Illness: Emily Andrade is a 38 y.o. Hispanic [id number] (Patient's last menstrual period was 07/21/2017.), seen for the above chief complaint. Her past medical history is significant for AMA, h/o c-section   She is s/p rLTCS on 12/10 for breech, PTL, previa at 38wks; she was discharged to home on POD#2  Vaginal bleeding or discharge: Yes  Breast or formula feeding: breast only at night PP depression s/s: No  Any bowel or bladder issues: notes some dysuria Pap smear: not seen  Review of Systems:  as noted in the History of Present Illness.  Patient Active Problem List   Diagnosis Date Noted  . Status post cesarean delivery 04/13/2018  . Language barrier 11/17/2017  . Supervision of high risk pregnancy, antepartum 09/24/2017  . AMA (advanced maternal age) multigravida 35+ 09/24/2017  . Previous cesarean delivery affecting pregnancy, antepartum 09/24/2017  . Depression   . Asthma     Medications Emily Andrade had no medications administered during this visit. Current Outpatient Medications  Medication Sig Dispense Refill  . ibuprofen (ADVIL,MOTRIN) 600 MG tablet Take 1 tablet (600 mg total) by mouth every 6 (six) hours. (Patient not taking: Reported on 05/11/2018) 30 tablet 0  . Prenatal Vit-Fe Fumarate-FA (MULTIVITAMIN-PRENATAL) 27-0.8 MG TABS tablet Take 1 tablet by mouth daily at 12 noon.     No current facility-administered medications for this visit.     Allergies Patient has no known allergies.  Physical Exam:  BP 112/76   Pulse 72   Wt 146 lb 8 oz (66.5 kg)   LMP 07/21/2017   BMI 26.96 kg/m  Body mass index is 26.96 kg/m. General appearance: Well nourished, well developed female in no  acute distress.  Cardiovascular: normal s1 and s2.  No murmurs, rubs or gallops. Respiratory:  Clear to auscultation bilateral. Normal respiratory effort Abdomen: positive bowel sounds and no masses, hernias; diffusely non tender to palpation, non distended. Well healed low transverse skin incision Neuro/Psych:  Normal mood and affect.  Skin:  Warm and dry.  Lymphatic:  No inguinal lymphadenopathy.   Pelvic exam: is not limited by body habitus EGBUS: within normal limits Vagina: within normal limits and with scant old blood in the vault Cervix:  no cmt or active bleeding. The cervix at the 1-2 o'clock position appears to have what appears to be an old laceration. It doesn't appear to be friable or bleeding.  Uterus:  nonenlarged and approximately 8 week sized Adnexa:  normal adnexa and no mass, fullness, tenderness Rectovaginal: deferred  Laboratory: none  PP Depression Screening:  epds score zero  Assessment: pt doing well  Plan:  Routine care. Condoms. I told her if AUB continues into mid to late February to let us know for repeat exam to check her cervix. She denies any h/o cx surgeries and no evidence of any extension on the op note. Pap done today. Okay for sexual intercourse.   Interpreter used  RTC PRN  Cornelia Copa MD Attending Center for [employer] [facility])

## 2018-05-13 LAB — CYTOLOGY - PAP
Diagnosis: NEGATIVE
HPV: NOT DETECTED

## 2018-09-30 ENCOUNTER — Telehealth: Payer: Self-pay | Admitting: Pediatric Intensive Care

## 2018-09-30 NOTE — Telephone Encounter (Signed)
[place],  She would need to be seen     Not able to do much without an exam and I am out all next week on PAL.  Sorry.   I am asking to see if another provider can see her at [facility]

## 2018-09-30 NOTE — Telephone Encounter (Signed)
Emily Andrade,   Is it possible to get this recent immigrant into our clinic next week for an exam?  I am out otherwise I would do this.  Dr Delford Field

## 2018-09-30 NOTE — Telephone Encounter (Signed)
Intrepretation via [employer]. Client states 2 months history of headaches and sore throat. She states foul breath and "little yellow balls" on left side of throat. She denies fevers, cough, body aches. She denies sick contacts. She states that she went to a clinic "with the [place] doctor" 8 days ago. She was prescribed amoxillin but discontinued due to diarrhea. She is also currently breastfeeding her son and has concerns about medication safety. CN states she will discuss with her medical director and return call with advice. Clients agrees to plan. Shann Medal RN BSN CNP [phone]

## 2018-10-05 ENCOUNTER — Ambulatory Visit: Payer: Self-pay | Attending: Primary Care | Admitting: Primary Care

## 2018-10-05 ENCOUNTER — Encounter: Payer: Self-pay | Admitting: Primary Care

## 2018-10-05 ENCOUNTER — Other Ambulatory Visit: Payer: Self-pay

## 2018-10-05 DIAGNOSIS — J029 Acute pharyngitis, unspecified: Secondary | ICD-10-CM

## 2018-10-05 NOTE — Progress Notes (Signed)
Virtual Visit via Telephone Note  I connected with Emily Andrade on 10/05/18 at  1:50 PM EDT by telephone and verified that I am speaking with the correct person using two identifiers.   I discussed the limitations, risks, security and privacy concerns of performing an evaluation and management service by telephone and the availability of in person appointments. I also discussed with the patient that there may be a patient responsible charge related to this service. The patient expressed understanding and agreed to proceed.   History of Present Illness: Emily Andrade was previously seen and tx with abt's for sore throat and c/o the medication was making her sick . Patient agree to come to the office so we could swab her throat and tx accordingly. Emily Andrade will not be prescribe until she comes in.   Observations/Objective: Review of Systems  Constitutional: Positive for malaise/fatigue.  HENT: Positive for sore throat.        Sore throat >7 days  Eyes: Negative.   Respiratory: Positive for cough.   Cardiovascular: Negative.   Gastrointestinal: Negative.   Genitourinary: Negative.   Musculoskeletal: Negative.   Skin: Negative.   Neurological: Negative.   Endo/Heme/Allergies: Negative.   Psychiatric/Behavioral: Negative.     Assessment and Plan: Diagnoses and all orders for this visit:  Sore throat Fever unknown will tx patient when she comes in to pick up medication . This will allow me to better assess her s/s. Throat swab and temp upon arrivial    Follow Up Instructions:    I discussed the assessment and treatment plan with the patient. The patient was provided an opportunity to ask questions and all were answered. The patient agreed with the plan and demonstrated an understanding of the instructions.   The patient was advised to call back or seek an in-person evaluation if the symptoms worsen or if the condition fails to improve as anticipated.  I provided 10 minutes of  non-face-to-face time during this encounter.   Grayce Sessions, NP

## 2018-10-05 NOTE — Progress Notes (Signed)
Patient verified DOB Patient has taken medication. Patient has eaten today. Patient complains of pain in her back, head and sorethroat. Patient states she has yellow balls in there throat on the left side. Patient complains of this concern being present for 2 months. Patient did not complete the antibiotics from the hospital because she had upset stomach and diarrhea. Patient is breastfeeding.

## 2018-10-07 ENCOUNTER — Telehealth: Payer: Self-pay | Admitting: Primary Care

## 2018-10-07 NOTE — Telephone Encounter (Signed)
FWD to PCP. Arelie Kuzel S Varonica Siharath, CMA  

## 2018-10-07 NOTE — Telephone Encounter (Signed)
Patient called stating she was expecting to be prescribed something more linient that wouldn't irritate her stomach. Please follow up.

## 2018-10-08 ENCOUNTER — Other Ambulatory Visit: Payer: Self-pay | Admitting: Primary Care

## 2018-10-08 ENCOUNTER — Telehealth: Payer: Self-pay | Admitting: Emergency Medicine

## 2018-10-08 NOTE — Telephone Encounter (Signed)
Patients called.  Patient identified by name and date of birth.  Patient called for Provider Edwards.  Patient asked which medication she was asking for.  Patient states she had a sore throat and was given Amoxicyllin by another doctor but the medicine upset her stomach.  She would like something that didn't upset her stomach.  Patient advised that she needs to come to the clinic to have her throat swabbed then a medicine would be prescribed.  Patient indicated she could come Monday afternoon.  Patient acknowledged understanding of advice.

## 2018-10-11 ENCOUNTER — Ambulatory Visit: Payer: Self-pay

## 2018-10-11 NOTE — Telephone Encounter (Signed)
Hey Dr Joya Gaskins,    Butler [place] [phone].    Do we  need to do a PEC letter to have her tested for COVID prior to coming into the office to swab her throat  I believe you prescribed her Amoxicillin which she states upsets her stomach.  She still has a sore throat and wanted a different medication.   I will reschedule her appointment to have her throat swabbed.    Thanks Dub Mikes

## 2018-10-11 NOTE — Telephone Encounter (Signed)
I was waiting for patient to come in for her abts so I could look in her throat and swab it. She never came. Sharyn Lull

## 2018-10-11 NOTE — Telephone Encounter (Signed)
The patient has not been seen in our clinic as of yet the antibiotic she received for the sore throat appears to be from an urgent care site.  This patient was referred to [place] by Congregational nurse program as she is a recent immigrant.  She had an appointment last week with Juluis Mire but apparently that did not occur.  I do not see that the telephone visit that Sharyn Lull started has been fully documented so I am unsure if she actually came in last week or not by way of the telephone visit  In any case in order to be able to send her for COVID testing she will need another telephone visit  As again it does not appear she has been seen officially yet in this clinic  that is my reading of the chart review  You can add her to my schedule tomorrow for a tele visit if you would like

## 2018-10-12 ENCOUNTER — Ambulatory Visit: Payer: Self-pay | Attending: Critical Care Medicine | Admitting: Critical Care Medicine

## 2018-10-12 ENCOUNTER — Other Ambulatory Visit: Payer: Self-pay

## 2018-10-12 ENCOUNTER — Telehealth: Payer: Self-pay

## 2018-10-12 ENCOUNTER — Encounter: Payer: Self-pay | Admitting: Critical Care Medicine

## 2018-10-12 ENCOUNTER — Telehealth: Payer: Self-pay | Admitting: *Deleted

## 2018-10-12 DIAGNOSIS — Z20822 Contact with and (suspected) exposure to covid-19: Secondary | ICD-10-CM

## 2018-10-12 DIAGNOSIS — J028 Acute pharyngitis due to other specified organisms: Secondary | ICD-10-CM

## 2018-10-12 DIAGNOSIS — B9789 Other viral agents as the cause of diseases classified elsewhere: Secondary | ICD-10-CM

## 2018-10-12 NOTE — Telephone Encounter (Signed)
Emily Andrade,  I will do a phone encounter today and likely send her for a covid test before she comes to our clinic for safety reasons.   Agree tele visit not ideal for sore throat symptoms

## 2018-10-12 NOTE — Progress Notes (Signed)
Patient ID: Emily Andrade, female   DOB: [birth year], 38 y.o.   MRN: [id number] Virtual Visit via Telephone Note  I connected with Emily Andrade on 10/12/18 at 10:30 AM EDT by telephone and verified that I am speaking with the correct person using two identifiers.   Consent:  I discussed the limitations, risks, security and privacy concerns of performing an evaluation and management service by telephone and the availability of in person appointments. I also discussed with the patient that there may be a patient responsible charge related to this service. The patient expressed understanding and agreed to proceed.  Location of patient: The patient was at home Location of provider: I was in my office Persons participating in the televisit with the patient.   The interpreter miguel [phone] was on the phone as interpreter for Spanish    History of Present Illness: This is a 38 year old female who has had several month history of sore throat focused on the left side along with left-sided ear pain.  She had seen an urgent care provider who prescribed amoxicillin 10 days ago but she developed diarrhea and nausea and had to stop this medication.  She recently had a baby and is currently breast-feeding.  She had yellowish discharge from left side of her throat but this is now resolved.  The left side of her throat is just red.  She has some difficulty swallowing.  She has left-sided ear pain.  She does note increased lymph nodes on the left side of her neck.  She denies fever or body aches.  She has no shortness of breath or cough.  She does have some chronic back pain which is been present since she had her baby.  There is no sick contacts there is no COVID exposure that she is aware of.  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  Her last temperature was 98 degrees and she has a temperature check when she goes to [employer].  She was  referred by [facility] nursing and we were concerned the patient needed COVID screening prior to coming into the clinic for direct face-to-face visit.    Constitutional:   No  weight loss, night sweats,  Fevers, chills, fatigue, lassitude. HEENT:    headaches,  Difficulty swallowing,  Tooth/dental problems,  Sore throat,                No sneezing, itching, L ear ache, nasal congestion, post nasal drip,   CV:  No chest pain,  Orthopnea, PND, swelling in lower extremities, anasarca, dizziness, palpitations  GI  No heartburn, indigestion, abdominal pain, nausea, vomiting, diarrhea, change in bowel habits, loss of appetite  Resp: No shortness of breath with exertion or at rest.  No excess mucus, no productive cough,  No non-productive cough,  No coughing up of blood.  No change in color of mucus.  No wheezing.  No chest wall deformity  Skin: no rash or lesions.  GU: no dysuria, change in color of urine, no urgency or frequency.  No flank pain.  MS:  No joint pain or swelling.  No decreased range of motion.  No back pain.  Psych:  No change in mood or affect. No depression or anxiety.  No memory loss.  Observations/Objective: This is a telephone visit there was no direct exam  Assessment and Plan: #1 sore throat and need for direct physical exam however high risk for COVID infection therefore  we will obtain a COVID test through the community screening drive-through program.  If that is negative we will then bring the patient into the office for a direct exam of the throat and to establish care for primary care Follow Up Instructions: The patient understands she will be called by a nurse for an appointment time to get her COVID test obtained   I discussed the assessment and treatment plan with the patient. The patient was provided an opportunity to ask questions and all were answered. The patient agreed with the plan and demonstrated an understanding of the instructions.   The patient  was advised to call back or seek an in-person evaluation if the symptoms worsen or if the condition fails to improve as anticipated.  I provided 30 minutes of non-face-to-face time during this encounter  including  median intraservice time , review of notes, labs, imaging, medications  and explaining diagnosis and management to the patient .    Asencion Noble, MD

## 2018-10-12 NOTE — Progress Notes (Signed)
Patient verified DOB Patient has not taken medication today. Patient has not eaten only had coffee. Patient complains of HA since Friday 10/07/2018 scaled currently at a 5 and with movement from side to side it increases. Patient still complains of throat discomfort with minimal relief with salt and warm water.

## 2018-10-12 NOTE — Telephone Encounter (Addendum)
Patient called, using [employer] I spoke to Quillian Quince ID #[id number] and advised the patient of the request for covid testing by Dr. Joya Gaskins, she verbalized understanding. She asks for the appointment to be scheduled tomorrow, appointment scheduled for tomorrow, 10/13/18 at 0800 at [facility], advised location and to wear a mask for everyone in the vehicle, she verbalized understanding. Order placed.     ----- Message from Elsie Stain, MD sent at 10/12/2018 10:42 AM EDT ----- Regarding: needs Covid test This patient needs COVID testing   she is mildly symptomatic and high risk. She has sore throat, headache, GI symptoms.  Not ill enough to go to ED.  I need the test done before I can safely have her come to our office for direct throat exam.  She speaks spanish FYI.  DOB.[birth year]  Self insured   Dr Lyda Jester

## 2018-10-13 ENCOUNTER — Other Ambulatory Visit: Payer: Self-pay

## 2018-10-13 DIAGNOSIS — Z20822 Contact with and (suspected) exposure to covid-19: Secondary | ICD-10-CM

## 2018-10-15 LAB — NOVEL CORONAVIRUS, NAA: SARS-CoV-2, NAA: NOT DETECTED

## 2018-10-15 NOTE — Telephone Encounter (Signed)
Patient completed COVID testing which resulted in being negative.

## 2018-10-17 DIAGNOSIS — J029 Acute pharyngitis, unspecified: Secondary | ICD-10-CM | POA: Insufficient documentation

## 2018-10-19 ENCOUNTER — Ambulatory Visit: Payer: Self-pay | Admitting: Critical Care Medicine

## 2018-10-19 NOTE — Progress Notes (Deleted)
   Subjective:    Patient ID: Emily Andrade, female    DOB: [birth year], 38 y.o.   MRN: [id number]  This is a 38 year old female who has had several month history of sore throat focused on the left side along with left-sided ear pain.  She had seen an urgent care provider who prescribed amoxicillin 10 days ago but she developed diarrhea and nausea and had to stop this medication.  She recently had a baby and is currently breast-feeding.  She had yellowish discharge from left side of her throat but this is now resolved.  The left side of her throat is just red.  She has some difficulty swallowing.  She has left-sided ear pain.  She does note increased lymph nodes on the left side of her neck.  She denies fever or body aches.  She has no shortness of breath or cough.  She does have some chronic back pain which is been present since she had her baby.  There is no sick contacts there is no COVID exposure that she is aware of.  She did work in [employer] now closed but she still goes in on occasion.  Her last temperature was 98 degrees and she has a temperature check when she goes to [employer].  She was referred by [place] and we were concerned the patient needed COVID screening prior to coming into the clinic for direct face-to-face visit.   Constitutional:   No  weight loss, night sweats,  Fevers, chills, fatigue, lassitude. HEENT:    headaches,  Difficulty swallowing,  Tooth/dental problems,  Sore throat,                No sneezing, itching, L ear ache, nasal congestion, post nasal drip,   CV:  No chest pain,  Orthopnea, PND, swelling in lower extremities, anasarca, dizziness, palpitations  GI  No heartburn, indigestion, abdominal pain, nausea, vomiting, diarrhea, change in bowel habits, loss of appetite  Resp: No shortness of breath with exertion or at rest.  No excess mucus, no productive cough,  No non-productive cough,  No coughing up of blood.  No  change in color of mucus.  No wheezing.  No chest wall deformity  Skin: no rash or lesions.  GU: no dysuria, change in color of urine, no urgency or frequency.  No flank pain.  MS:  No joint pain or swelling.  No decreased range of motion.  No back pain.  Psych:  No change in mood or affect. No depression or anxiety.  No memory loss.  Observations/Objective: This is a telephone visit there was no direct exam  Assessment and Plan: #1 sore throat and need for direct physical exam however high risk for COVID infection therefore we will obtain a COVID test through the community screening drive-through program.  If that is negative we will then bring the patient into the office for a direct exam of the throat and to establish care for primary care Follow Up Instructions:     Review of Systems     Objective:   Physical Exam        Assessment & Plan:

## 2018-10-27 ENCOUNTER — Encounter: Payer: Self-pay | Admitting: Critical Care Medicine

## 2018-10-27 ENCOUNTER — Ambulatory Visit: Payer: Self-pay | Attending: Critical Care Medicine | Admitting: Critical Care Medicine

## 2018-10-27 ENCOUNTER — Other Ambulatory Visit: Payer: Self-pay

## 2018-10-27 ENCOUNTER — Telehealth: Payer: Self-pay | Admitting: Emergency Medicine

## 2018-10-27 VITALS — BP 132/85 | HR 85 | Temp 98.8°F | Resp 17 | Ht 65.0 in | Wt 148.2 lb

## 2018-10-27 DIAGNOSIS — J029 Acute pharyngitis, unspecified: Secondary | ICD-10-CM

## 2018-10-27 DIAGNOSIS — R682 Dry mouth, unspecified: Secondary | ICD-10-CM

## 2018-10-27 MED ORDER — FIRST-DUKES MOUTHWASH MT SUSP: 15.0000 mL | Freq: Three times a day (TID) | OROMUCOSAL | 0 refills | Status: DC

## 2018-10-27 MED ORDER — BIOTENE DRY MOUTH MT LIQD: 15.0000 mL | OROMUCOSAL | Status: DC | PRN

## 2018-10-27 NOTE — Assessment & Plan Note (Signed)
There appears to be significant dryness in the throat and I am concerned about the possibility of Sjorgrens Syndrome I do not see any active infection of the throat at this time and note the strep screen was negative  Plan will be to obtain Sjorgren Antibodies and sed rate We will also prescribe Biotene rinse and also Dukes mouthwash  May yet need ENT referral and will need for the patient to complete financial application for an orange card

## 2018-10-27 NOTE — Assessment & Plan Note (Signed)
Review sore throat assessment

## 2018-10-27 NOTE — Addendum Note (Signed)
Addended by: Asencion Noble E on: 10/27/2018 11:36 AM   Modules accepted: Orders

## 2018-10-27 NOTE — Telephone Encounter (Signed)
Patients call returned.  Patient identified by name and date of birth.  Patient informed that mouthwash prescription was sent to [facility].  Patient acknowledged understanding of advice.

## 2018-10-27 NOTE — Patient Instructions (Signed)
Obtain labs today to check for inflammation in the salivary glands  Obtain Biotene mouthwash over-the-counter and swish gargle and spit out 15 mL's 3 times daily  Dukes mouthwash will also be prescribed from our pharmacy we will call you when that is ready  Please complete your financial assistance application as we will likely need to send you to a throat specialist  Return for follow-up with Dr. Joya Gaskins 2 weeks

## 2018-10-27 NOTE — Progress Notes (Signed)
Subjective:    Patient ID: Emily Andrade, female    DOB: 01-11-1981, 38 y.o.   MRN: [id number]  This is a 38 year old female who has had several month history of sore throat focused on the left side along with left-sided ear pain.  She had seen an urgent care provider who prescribed amoxicillin but she developed diarrhea and nausea and had to stop this medication.  She recently had a baby and is currently breast-feeding.  She had yellowish discharge from left side of her throat but this is now resolved.  The left side of her throat is just red.  She has some difficulty swallowing.  She has left-sided ear pain.  She does note increased lymph nodes on the left side of her neck.  She denies fever or body aches.  She has no shortness of breath or cough.  She does have some chronic back pain which is been present since she had her baby.  There is no sick contacts there is no COVID exposure that she is aware of.  She did work in [employer] now closed but she still goes in on occasion.  Her last temperature was 98 degrees and she has a temperature check when she goes to [employer].  Note I had previously connected with this patient several weeks ago with a telephone visit she then had a COVID test which was negative  Today the patient returns and is still having some difficulty with the sore throat.  The patient's ear pain is unchanged as well.  There is pain in the left temple and jaw as well. The patient notes she has extremely dry mouth and the throat is sore at night  The drainage in the throat has resolved.  There is no sinus congestion.  There remains no fever.  Please review sore throat assessment below  Sore Throat  This is a chronic problem. The current episode started more than 1 month ago. The problem has been unchanged (ABX without much change). The pain is worse on the left side. There has been no fever. The pain is at a severity of 5/10. The pain is moderate  (stabbing radiate into the ear, ). Pertinent negatives include no congestion, coughing, diarrhea, ear discharge, ear pain, headaches, hoarse voice, plugged ear sensation, neck pain, shortness of breath, stridor, swollen glands, trouble swallowing or vomiting. Associated symptoms comments: Mastoid pain  Throat stays dry. Treatments tried: ABX. The treatment provided no relief.   Past Medical History:  Diagnosis Date   Anemia    Asthma    childhood   Depression    Frequent UTI      Family History  Problem Relation Age of Onset   Heart disease Father        MI   Diabetes Father    Birth defects Other        Downs syndrome, cleft lip and palate     Social History   Socioeconomic History   Marital status: Single    Spouse name: Not on file   Number of children: Not on file   Years of education: Not on file   Highest education level: Not on file  Occupational History   Not on file  Social Needs   Financial resource strain: Not on file   Food insecurity    Worry: Not on file    Inability: Not on file   Transportation needs    Medical: Not on file    Non-medical:  Not on file  Tobacco Use   Smoking status: Never Smoker   Smokeless tobacco: Never Used  Substance and Sexual Activity   Alcohol use: No    Alcohol/week: 0.0 standard drinks   Drug use: No   Sexual activity: Yes    Birth control/protection: None  Lifestyle   Physical activity    Days per week: Not on file    Minutes per session: Not on file   Stress: Not on file  Relationships   Social connections    Talks on phone: Not on file    Gets together: Not on file    Attends religious service: Not on file    Active member of club or organization: Not on file    Attends meetings of clubs or organizations: Not on file    Relationship status: Not on file   Intimate partner violence    Fear of current or ex partner: Not on file    Emotionally abused: Not on file    Physically abused: Not  on file    Forced sexual activity: Not on file  Other Topics Concern   Not on file  Social History Narrative   Not on file     No Known Allergies   Outpatient Medications Prior to Visit  Medication Sig Dispense Refill   ibuprofen (ADVIL,MOTRIN) 600 MG tablet Take 1 tablet (600 mg total) by mouth every 6 (six) hours. (Patient not taking: Reported on 10/27/2018) 30 tablet 0   Prenatal Vit-Fe Fumarate-FA (MULTIVITAMIN-PRENATAL) 27-0.8 MG TABS tablet Take 1 tablet by mouth daily at 12 noon.     No facility-administered medications prior to visit.     Review of Systems  HENT: Negative for congestion, ear discharge, ear pain, hoarse voice and trouble swallowing.   Respiratory: Negative for cough, shortness of breath and stridor.   Gastrointestinal: Negative for diarrhea and vomiting.  Musculoskeletal: Negative for neck pain.  Neurological: Negative for headaches.       Objective:   Physical Exam Vitals:   10/27/18 0902  BP: 132/85  Pulse: 85  Resp: 17  Temp: 98.8 F (37.1 C)  TempSrc: Oral  SpO2: 97%  Weight: 148 lb 3.2 oz (67.2 kg)  Height: 5\' 5"  (1.651 m)    Gen: Pleasant, well-nourished, in no distress,  normal affect  ENT: The patient's mouth is extremely dry.  The tongue has furrows on it.  The back of the throat does not show erythema.  There is no purulence or enlargement of either tonsil.  There is no pustular areas seen in the left side of the throat.  The back of the throat appears to be quite dry as well.  There is no lymphadenopathy in the neck the sinuses are clear the ears are clear without purulence or cerumen nares are clear and the salivary glands do not appear to be tender or enlarged  Neck: No JVD, no TMG, no carotid bruits  Lungs: No use of accessory muscles, no dullness to percussion, clear without rales or rhonchi  Cardiovascular: RRR, heart sounds normal, no murmur or gallops, no peripheral edema  Abdomen: soft and NT, no HSM,  BS  normal  Musculoskeletal: No deformities, no cyanosis or clubbing  Neuro: alert, non focal  Skin: Warm, no lesions or rashes       Assessment & Plan:  I personally reviewed all images and lab [facility] as well as any outside material available during this office visit and agree with the  radiology impressions.   Sore throat There appears to be significant dryness in the throat and I am concerned about the possibility of Sjorgrens Syndrome I do not see any active infection of the throat at this time and note the strep screen was negative  Plan will be to obtain Sjorgren Antibodies and sed rate We will also prescribe Biotene rinse and also Dukes mouthwash  May yet need ENT referral and will need for the patient to complete financial application for an orange card   Dry mouth Review sore throat assessment   Norwood LevoDina was seen today for sore throat.  Diagnoses and all orders for this visit:  Sore throat -     Sjogrens syndrome-A extractable nuclear antibody; Future -     Sjogrens syndrome-B extractable nuclear antibody; Future -     Sedimentation rate -     Sjogrens syndrome-B extractable nuclear antibody -     Sjogrens syndrome-A extractable nuclear antibody  Dry mouth -     Sjogrens syndrome-A extractable nuclear antibody; Future -     Sjogrens syndrome-B extractable nuclear antibody; Future -     Sedimentation rate -     Sjogrens syndrome-B extractable nuclear antibody -     Sjogrens syndrome-A extractable nuclear antibody  Other orders -     Diphenhyd-Hydrocort-Nystatin (FIRST-DUKES MOUTHWASH) SUSP; Use as directed 15 mLs in the mouth or throat 3 (three) times daily. -     antiseptic oral rinse (BIOTENE) LIQD; 15 mLs by Mouth Rinse route as needed for dry mouth.

## 2018-10-28 LAB — SJOGRENS SYNDROME-B EXTRACTABLE NUCLEAR ANTIBODY: ENA SSB (LA) Ab: <0.2 AI (ref 0.0–0.9)

## 2018-10-28 LAB — SEDIMENTATION RATE: Sed Rate: 23 mm/hr (ref 0–32)

## 2018-10-28 LAB — SJOGRENS SYNDROME-A EXTRACTABLE NUCLEAR ANTIBODY: ENA SSA (RO) Ab: <0.2 AI (ref 0.0–0.9)

## 2018-10-29 ENCOUNTER — Telehealth: Payer: Self-pay | Admitting: Emergency Medicine

## 2018-10-29 NOTE — Telephone Encounter (Signed)
Patient contacted via phone to be given results of labs.  Patient identified by name and date of birth.  Patient given results of labs.  Patient educated on lab results. Questions answered. Patient acknowledged understanding of labs results. 

## 2018-11-29 ENCOUNTER — Telehealth: Payer: Self-pay | Admitting: Primary Care

## 2018-11-29 NOTE — Telephone Encounter (Signed)
Pt submitted documents for CAFA and OC, Pt did not have an appt, try to explain to she did not let, so I sending back her paper with the application also a note included that need to call an schedule a financial appt and then submitted application with papers

## 2020-03-26 ENCOUNTER — Encounter: Payer: Self-pay | Admitting: *Deleted

## 2020-03-26 NOTE — Congregational Nurse Program (Signed)
  Dept: [phone]   Congregational Nurse Program Note  Date of Encounter: 03/26/2020  Past Medical History: Past Medical History:  Diagnosis Date  . Anemia   . Asthma    childhood  . Depression   . Frequent UTI     Encounter Details:  CNP Questionnaire - 03/26/20 1610      Questionnaire   Do you give verbal consent to treat you today? Yes    Visit Setting Church or Organization    Location Patient Served At [facility]    Patient Status Immigrant    Medical Provider Yes    Insurance Uninsured (Includes Orange Card/Care [place])    Intervention Advocate    Food Have food insecurities    Medication Have medication insecurities    ED Visit Averted Yes           Client reached out asking interpreter at [place] [employer] to assist client to see someone to have labs drawn.  Client would like to have her blood checked.  Client has been seen by Gwinda Passe, NP most recently and by Dr. Delford Field in the past year.  Client to let me know which provider she prefers to see and I will then see if I can get client an appointment.  I will reach out to client again tomorrow if I do not hear back from her today.  Roderic Palau, RN, MSN, CNP [phone] Office [phone] Cell

## 2020-03-26 NOTE — Congregational Nurse Program (Signed)
  Dept: [phone]   Congregational Nurse Program Note  Date of Encounter: 03/26/2020  Past Medical History: Past Medical History:  Diagnosis Date  . Anemia   . Asthma    childhood  . Depression   . Frequent UTI     Encounter Details:  CNP Questionnaire - 03/26/20 1655      Questionnaire   Do you give verbal consent to treat you today? Yes    Visit Setting Church or Organization    Location Patient Served At [facility]    Patient Status Immigrant    Medical Provider Yes    Insurance Uninsured (Includes Orange Card/Care [employer])    Intervention Advocate;Support    Food Have food insecurities    Medication Have medication insecurities    Referrals PCP - [place]    ED Visit Averted Yes          Spoke with Erskine Squibb at [employer] and [facility].  Appointment made for client with Dr. Earlene Plater on Dec 9th at 1:50 pm to re-establish primary care at [facility] at [facility].  Address is [address] In [place].  Client was notified through interpreter from [facility] of appointment.  Client also requested assistance with orange card application.  I will assist with orange card app.  Roderic Palau, RN, MSN, CNP [phone] Office [phone] Cell

## 2020-03-28 ENCOUNTER — Telehealth: Payer: Self-pay | Admitting: *Deleted

## 2020-03-28 NOTE — Telephone Encounter (Signed)
Call placed to Jane at [phone] with request from client to change appointment to a Monday.  Appointment for Primary Care at [facility] with Dr. Earlene Plater changed to Dec 6 at 3:10 pm.  Information was relayed to client by interpreter, Emilie Rutter,  from [facility].  Will follow up as needed.  Roderic Palau, RN, MSN, CNP [phone] Office [phone] Cell

## 2020-04-09 ENCOUNTER — Telehealth: Payer: Self-pay | Admitting: Internal Medicine

## 2020-04-12 ENCOUNTER — Ambulatory Visit: Payer: Self-pay | Admitting: Internal Medicine

## 2020-05-22 ENCOUNTER — Ambulatory Visit: Payer: Self-pay

## 2020-06-05 ENCOUNTER — Ambulatory Visit (INDEPENDENT_AMBULATORY_CARE_PROVIDER_SITE_OTHER): Payer: Self-pay

## 2020-06-05 ENCOUNTER — Other Ambulatory Visit: Payer: Self-pay

## 2020-06-05 DIAGNOSIS — Z23 Encounter for immunization: Secondary | ICD-10-CM

## 2020-06-05 NOTE — Progress Notes (Signed)
   Covid-19 Vaccination Clinic  Name:  Amiria Orrison    MRN: [id number] DOB: 1981-03-17  06/05/2020  Ms. Ajwa Kimberley was observed post Covid-19 immunization for 15 minutes without incident. She was provided with Vaccine Information Sheet and instruction to access the V-Safe system.   Ms. Creola Krotz was instructed to call 911 with any severe reactions post vaccine: Marland Kitchen Difficulty breathing  . Swelling of face and throat  . A fast heartbeat  . A bad rash all over body  . Dizziness and weakness   Immunizations Administered    Name Date Dose VIS Date Route   Pfizer COVID-19 Vaccine 06/05/2020  5:32 PM 0.3 mL 02/22/2020 Intramuscular   Manufacturer: [employer], [employer]   Lot: [id number]   NDC: [address]-9

## 2020-06-23 ENCOUNTER — Ambulatory Visit: Payer: Self-pay

## 2020-08-07 ENCOUNTER — Encounter: Payer: Self-pay | Admitting: *Deleted

## 2020-08-08 NOTE — Congregational Nurse Program (Signed)
  Dept: [phone]   Congregational Nurse Program Note  Date of Encounter: 08/07/2020  Past Medical History: Past Medical History:  Diagnosis Date  . Anemia   . Asthma    childhood  . Depression   . Frequent UTI     Encounter Details:  CNP Questionnaire - 08/07/20 1930     Questionnaire   Do you give verbal consent to treat you today? Yes    Visit Setting Home    Location Patient Served At [facility]    Patient Status Immigrant    Medical Provider Yes    Insurance Uninsured (Includes [facility] Card/Care [place])    Intervention Assess (including screenings);Advocate;Support;Educate    Food Have food insecurities    Medication Have medication insecurities    ED Visit Averted Yes          This CN and a spanish interpreter, Talbert Forest, visited client in her home.  Client reports a headache and stress.  Client saw an MD last Monday on [place]. Who speaks Spanish.  Client tells [place] that she was diagnosed with anxiety and depression and a referral was made.  The cost to see MD was more than $300 and client is worried about finances.  Her lab results show high cholesterol.  Discussed walking 30 minutes a day and eating a healthy diet.  Client is worried because her period is delayed, but she is not pregnant per test results.  She also reports kidney pain.  She is interested in establishing medical care through [facility], but had an unsatisfactory experience with [facility].  She has applied for an orange card previously but was denied.  Will call to see if client can get an appointment at [facility] for follow up.  I will follow up with client.  Roderic Palau, RN, MSN, CNP [phone] Office [phone] Cell

## 2020-09-17 ENCOUNTER — Other Ambulatory Visit: Payer: Self-pay

## 2020-09-17 ENCOUNTER — Telehealth: Payer: Self-pay

## 2020-09-17 ENCOUNTER — Ambulatory Visit (INDEPENDENT_AMBULATORY_CARE_PROVIDER_SITE_OTHER): Payer: Self-pay | Admitting: Internal Medicine

## 2020-09-17 ENCOUNTER — Encounter: Payer: Self-pay | Admitting: Internal Medicine

## 2020-09-17 VITALS — BP 116/75 | HR 77 | Temp 97.7°F | Ht 65.0 in | Wt 155.2 lb

## 2020-09-17 DIAGNOSIS — R0789 Other chest pain: Secondary | ICD-10-CM

## 2020-09-17 DIAGNOSIS — M5412 Radiculopathy, cervical region: Secondary | ICD-10-CM

## 2020-09-17 DIAGNOSIS — J4520 Mild intermittent asthma, uncomplicated: Secondary | ICD-10-CM

## 2020-09-17 DIAGNOSIS — E782 Mixed hyperlipidemia: Secondary | ICD-10-CM

## 2020-09-17 DIAGNOSIS — R079 Chest pain, unspecified: Secondary | ICD-10-CM

## 2020-09-17 MED ORDER — ALBUTEROL SULFATE HFA 108 (90 BASE) MCG/ACT IN AERS: 1.0000 | INHALATION_SPRAY | Freq: Four times a day (QID) | RESPIRATORY_TRACT | 2 refills | Status: DC | PRN

## 2020-09-17 MED ORDER — ALBUTEROL SULFATE HFA 108 (90 BASE) MCG/ACT IN AERS: INHALATION_SPRAY | RESPIRATORY_TRACT | 2 refills | Status: DC

## 2020-09-17 NOTE — Telephone Encounter (Signed)
Returned call to [employer] at [place]. Requesting Rx for albuterol be resent in [place].

## 2020-09-17 NOTE — Telephone Encounter (Signed)
Pls contact pharmacy [phone]

## 2020-09-17 NOTE — Patient Instructions (Signed)
It was nice seeing you today! Thank you for choosing [facility] for your Primary Care.    Today we talked about:   1. Back pain and Arm Pain: This may be a pinched nerve in your back. Take Aleve, 1 tablet twice per day, every day for 10 days. Additionally, I have printed out some exercises for you to do.   2. Chest Pain: I suspect this is your asthma. I have sent in a prescription for an inhaler to use when you feel short of breathe or chest pain while exercising or sick. If you chest pain does not get better with the inhaler, please call the clinic immediately.   3. Cholesterol: I have printed out some information on diet changes you can make to decrease your cholesterol ------------------------------------ Fue agradable verte hoy! Emily Andrade por elegir [facility] para su Atencin Primaria.   Hoy hablamos de:  Dolor de espalda y dolor en el brazo: Esto puede ser un nervio pellizcado en la espalda. [employer], 1 tableta [employer], [employer]. Adems, he impreso algunos ejercicios para que los hagas.  Dolor en el pecho: sospecho que es su asma. He enviado una receta para un inhalador para usar cuando sienta dificultad para respirar o dolor en el pecho mientras hace ejercicio o est enfermo. Si su dolor de pecho no mejora con Therapist, nutritional, llame a la clnica de inmediato.  Colesterol: he impreso informacin [employer] en la dieta que puede hacer para reducir Print production planner.

## 2020-09-17 NOTE — Telephone Encounter (Signed)
Okay it has been sent now.

## 2020-09-18 DIAGNOSIS — E785 Hyperlipidemia, unspecified: Secondary | ICD-10-CM | POA: Insufficient documentation

## 2020-09-18 DIAGNOSIS — M5412 Radiculopathy, cervical region: Secondary | ICD-10-CM | POA: Insufficient documentation

## 2020-09-18 DIAGNOSIS — R0789 Other chest pain: Secondary | ICD-10-CM | POA: Insufficient documentation

## 2020-09-18 NOTE — Assessment & Plan Note (Addendum)
[name] endorses a history of chest tightness that tends to occur when she is exercising or sick.  It is substernal and is somewhat pressure-like in nature.  When the chest pain occurs, she tends to have coughing as well.  She denies any palpitations.  She denies any personal history of cardiac disease but does have family history of cardiac disease.  No known history of diabetes or HTN.  Assessment/plan: Given patient's history of asthma and and cough with chest tightness, fever that this is secondary to her asthma.  She has low probability that this is cardiac related.  I did recommend an EKG to get a baseline, however patient was late for work and unable to afford the EKG at this time.  -See separate A&P for asthma treatment

## 2020-09-18 NOTE — Assessment & Plan Note (Signed)
[name] states she has a PMHx of childhood asthma that was severe and required management with inhalers. At this time, she has not been on inhaler treatment for a long time though. She has noticed chest tightness and coughing when exercising or ill. Otherwise, no shortness of breathe at rest.   Assessment/Plan:  Consistent with mild intermittent asthma. Will start with PRN Albuterol.   - Albuterol q6h PRN for SOB/wheezing/chest tightness

## 2020-09-18 NOTE — Progress Notes (Signed)
CC: Back and arm pain, establish care   HPI:  [name] is a 40 y.o. with a PMHx as listed below who presents to the clinic for Back and arm pain, establish care .   Please see the Encounters tab for problem-based Assessment & Plan regarding status of patient's acute and chronic conditions.  Past Medical History:  Diagnosis Date  . Anemia   . Asthma    childhood  . Depression   . Frequent UTI    Past Surgical History:  C-section   Past Family History:  Cardiac disease  (father, grandparents)  Social History:  Lives in [place]  Denies any alcohol, drug or tobacco use.    Review of Systems: Review of Systems  Constitutional: Negative for chills, fever, malaise/fatigue and weight loss.  HENT: Positive for congestion ("getting over cold"). Negative for sore throat.   Respiratory: Positive for cough and shortness of breath. Negative for wheezing.   Cardiovascular: Positive for chest pain ("tightness"). Negative for palpitations and leg swelling.  Gastrointestinal: Negative for abdominal pain, constipation, diarrhea, nausea and vomiting.  Musculoskeletal: Positive for back pain and myalgias. Negative for falls and joint pain.  Skin: Negative for itching and rash.  Neurological: Negative for dizziness, focal weakness and headaches.   Physical Exam:  Vitals:   09/17/20 0843  BP: 116/75  Pulse: 77  Temp: 97.7 F (36.5 C)  TempSrc: Oral  SpO2: 97%  Weight: 155 lb 3.2 oz (70.4 kg)  Height: 5\' 5"  (1.651 m)   Physical Exam Vitals and nursing note reviewed.  Constitutional:      General: She is not in acute distress.    Appearance: She is normal weight.  HENT:     Head: Normocephalic and atraumatic.     Nose: Rhinorrhea present.     Mouth/Throat:     Mouth: Mucous membranes are moist.     Pharynx: Oropharynx is clear. No oropharyngeal exudate or posterior oropharyngeal erythema.  Eyes:     Extraocular Movements: Extraocular movements intact.      Conjunctiva/sclera: Conjunctivae normal.  Cardiovascular:     Rate and Rhythm: Normal rate and regular rhythm.     Heart sounds: No murmur heard. No gallop.   Pulmonary:     Effort: Pulmonary effort is normal. No respiratory distress.     Breath sounds: Wheezing (minimal wheeze in the left lung base) present. No rales.  Abdominal:     General: Bowel sounds are normal. There is no distension.     Palpations: Abdomen is soft.     Tenderness: There is no abdominal tenderness. There is no guarding.  Musculoskeletal:     Cervical back: Bony tenderness (Tenderness overlying the spinal process from C6 - T1 ) present. No swelling, edema, deformity or erythema.     Right lower leg: No edema.     Left lower leg: No edema.  Skin:    General: Skin is warm and dry.  Neurological:     General: No focal deficit present.     Mental Status: She is alert and oriented to person, place, and time. Mental status is at baseline.     Comments: Upper extremity strength 5/5, both distal and proximal  Psychiatric:        Mood and Affect: Mood normal.        Behavior: Behavior normal.        Thought Content: Thought content normal.        Judgment: Judgment normal.    Assessment &  Plan:   See Encounters Tab for problem based charting.  Patient discussed with Dr. Dareen Piano

## 2020-09-18 NOTE — Assessment & Plan Note (Signed)
[name] endorses a longtime history of upper back pain that has been intermittent.  She states approximately 1 month ago, her back pain started again and has not stopped since.  The pain is worse overlying the cervical neck and seems to radiate into her left arm.  She endorses left arm tingling that radiates from the top down to her third fourth and fifth digit along the medial aspect.  Alleviating factors include stretches and aggravating factors include prolonged standing at work.  Assessment/plan: On examination today, patient has tenderness to palpation along the lower to lower cervical spine extending to the upper region of the thoracic spine.  Her description of neuropathy extending in her left arm consistent with radiculopathy in the of C8-T1.  We will treat this as acute radiculopathy with NSAID therapy.  - Aleve twice daily for 10 days - Cervical neck stretches provided - Follow-up if no relief

## 2020-09-18 NOTE — Assessment & Plan Note (Signed)
Labs obtained on 07/30/2020:  Total Cholesterol: 219 HDL: 60 LDL: 146  Patient would like to discuss her hyperlipidemia today.  She notes no prior history of this before.  The patient has no history of hypertension or diabetes. No indication for statin therapy.  - Lifestyle changes recommended, including diet

## 2020-09-20 NOTE — Progress Notes (Signed)
Internal Medicine Clinic Attending  Case discussed with Dr. Basaraba  At the time of the visit.  We reviewed the resident's history and exam and pertinent patient test results.  I agree with the assessment, diagnosis, and plan of care documented in the resident's note.  

## 2020-09-21 ENCOUNTER — Telehealth: Payer: Self-pay

## 2020-09-21 NOTE — Telephone Encounter (Signed)
RTC to pharmacy, spoke with pharmacy tech, Dorathy Daft.  She is requesting the RX for Albuterol be sent in English, states they only have it in [place]. RN informed tech the RX was already re-sent in [place] on 09/17/20 via [facility] w/ receipt confirmation [facility] received the RX. Tech states she will look again, now states she found the RX in [place], that patient had 2 charts. SChaplin, RN,BSN

## 2020-09-21 NOTE — Telephone Encounter (Signed)
Pls contact pharmacy [phone] 

## 2020-11-16 ENCOUNTER — Other Ambulatory Visit: Payer: Self-pay

## 2020-11-16 DIAGNOSIS — N644 Mastodynia: Secondary | ICD-10-CM

## 2020-12-25 ENCOUNTER — Ambulatory Visit
Admission: RE | Admit: 2020-12-25 | Discharge: 2020-12-25 | Disposition: A | Discharge status: Home / Self Care | Payer: No Typology Code available for payment source | Source: Ambulatory Visit | Attending: Obstetrics and Gynecology | Admitting: Obstetrics and Gynecology

## 2020-12-25 ENCOUNTER — Ambulatory Visit: Payer: Self-pay | Admitting: *Deleted

## 2020-12-25 ENCOUNTER — Ambulatory Visit: Payer: Self-pay

## 2020-12-25 ENCOUNTER — Other Ambulatory Visit: Payer: Self-pay

## 2020-12-25 VITALS — BP 120/74 | Wt 159.6 lb

## 2020-12-25 DIAGNOSIS — N644 Mastodynia: Secondary | ICD-10-CM

## 2020-12-25 DIAGNOSIS — Z1239 Encounter for other screening for malignant neoplasm of breast: Secondary | ICD-10-CM

## 2020-12-25 NOTE — Patient Instructions (Signed)
Explained breast self awareness with Marguerita Merles. Patient did not need a Pap smear today due to last Pap smear and HPV typing was 05/11/2018. Let her know BCCCP will cover Pap smears and HPV typing every 5 years unless has a history of abnormal Pap smears. Referred patient to the [facility] of [facility] for a diagnostic mammogram. Appointment scheduled Tuesday, December 25, 2020 at 1030. Patient aware of appointment and will be there. Ty Oshima verbalized understanding.  Sherica Paternostro, Kathaleen Maser, RN 9:38 AM

## 2020-12-25 NOTE — Progress Notes (Signed)
Emily Andrade is a 40 y.o. female who presents to [facility] clinic today with complaint of right breast pain x 5 months. Patient states the pain comes and goes. Patient states the pain starts at the nipple and radiates to the axilla. Patient rates the pain at a 5 out of 10. Patient stated she stopped breastfeeding 2 months ago and noticed some redness that went away 1.5 months ago.    Pap Smear: Pap smear not completed today. Last Pap smear was 05/11/2018 at [facility] for [facility] at [facility] and was normal with negative HPV. Per patient has no history of an abnormal Pap smear. Last Pap smear result is available in Epic.   Physical exam: Breasts Breasts symmetrical. No skin abnormalities bilateral breasts. No nipple retraction bilateral breasts. No nipple discharge bilateral breasts. No lymphadenopathy. No lumps palpated bilateral breasts. Complaints of right outer breast tenderness at 9 o'clock on exam.    Pelvic/Bimanual Pap is not indicated today per BCCCP guidelines.    Smoking History: Patient has never smoked.   Patient Navigation: Patient education provided. Access to services provided for patient through [place] program. Spanish interpreter [name] from [facility] provided.    Breast and Cervical Cancer Risk Assessment: Patient has family history maternal cousin having breast cancer. Patient has no known genetic mutations or history of radiation treatment to the chest before age 49. Patient does not have history of cervical dysplasia, immunocompromised, or DES exposure in-utero.  Risk Assessment     Risk Scores       12/25/2020   Last edited by: Narda Rutherford, LPN   5-year risk: 0.6 %   Lifetime risk: 8.7 %            A: BCCCP exam without pap smear Complaint of right outer breast pain.  P: Referred patient to the [facility] for a diagnostic mammogram. Appointment scheduled Tuesday, December 25, 2020 at  1030.  Priscille Heidelberg, RN 12/25/2020 9:38 AM

## 2021-03-01 ENCOUNTER — Other Ambulatory Visit: Payer: Self-pay | Admitting: Critical Care Medicine

## 2021-03-01 MED ORDER — FLUCONAZOLE 100 MG PO TABS: ORAL_TABLET | ORAL | 0 refills | Status: DC

## 2021-03-01 NOTE — Progress Notes (Signed)
Rx for yeast infection.  Needs appt with Dr Delford Field at [facility]

## 2021-04-21 NOTE — Progress Notes (Signed)
Established Patient Office Visit  Subjective:  Patient ID: Emily Andrade, female    DOB: Jul 17, 1980  Age: 40 y.o. MRN: [id number]  CC:  Chief Complaint  Patient presents with   Abdominal Pain   Sore Throat    HPI Emily Andrade presents for primary care follow-up and patient is accompanied with language resource in person interpreter Byrd Hesselbach for Spanish Patient notes increased onset of chest tightness and anterior back pain with increased cough productive of thick yellow mucus and a severe sore throat that is been going on for several days.  Patient also has been missing her periods last period was early November.  She has a 73-year-old and an 42-year-old at home and does not wish to have further children only birth control is that her partner uses a condom.  Patient also notes increased stress and depression during the December months around the holidays but because of loss of family members this past year that she was close to.  The patient is not suicidal at this time.  Patient complains of right-sided lower abdominal pain associated with irregular periods  No other complaints at this time  Patient does not drink alcohol or smoke tobacco products  Patient previously was seen in the internal medicine clinic and had been previously in this clinic in 2020 during the COVID pandemic.  She wishes to establish now in this clinic. Patient did have a negative mammogram earlier this year    Past Medical History:  Diagnosis Date   Anemia    Asthma    childhood   Depression    Frequent UTI    Hyperlipidemia     Past Surgical History:  Procedure Laterality Date   CESAREAN SECTION N/A 04/08/2013   Procedure: CESAREAN SECTION;  Surgeon: Reva Bores, MD;  Location: [facility];  Service: Obstetrics;  Laterality: N/A;   CESAREAN SECTION N/A 04/13/2018   Procedure: CESAREAN SECTION;  Surgeon: Levie Heritage, DO;  Location: [facility];  Service: Obstetrics;   Laterality: N/A;    Family History  Problem Relation Age of Onset   Hypertension Mother    Heart disease Father        MI   Diabetes Father    Diabetes Sister    Birth defects Other        Downs syndrome, cleft lip and palate    Social History   Socioeconomic History   Marital status: Single    Spouse name: Not on file   Number of children: 2   Years of education: Not on file   Highest education level: 6th grade  Occupational History   Not on file  Tobacco Use   Smoking status: Never   Smokeless tobacco: Never  Vaping Use   Vaping Use: Never used  Substance and Sexual Activity   Alcohol use: No    Alcohol/week: 0.0 standard drinks   Drug use: No   Sexual activity: Yes    Birth control/protection: None  Other Topics Concern   Not on file  Social History Narrative   Not on file   Social Determinants of Health   Financial Resource Strain: Not on file  Food Insecurity: No Food Insecurity   Worried About Running Out of Food in the Last Year: Never true   Ran Out of Food in the Last Year: Never true  Transportation Needs: No Transportation Needs   Lack of Transportation (Medical): No   Lack of Transportation (Non-Medical): No  Physical Activity:  Not on file  Stress: Not on file  Social Connections: Not on file  Intimate Partner Violence: Not on file    Outpatient Medications Prior to Visit  Medication Sig Dispense Refill   benzonatate (TESSALON) 200 MG capsule benzonatate 200 mg capsule  Take 1 capsule 3 times a day by oral route as needed. (Patient not taking: Reported on 04/22/2021)     cyanocobalamin (,VITAMIN B-12,) 1000 MCG/ML injection Inject as directed once a week. (Patient not taking: Reported on 04/22/2021)     fluconazole (DIFLUCAN) 100 MG tablet Take two first day then one daily (Patient not taking: Reported on 04/22/2021) 7 tablet 0   loratadine (CLARITIN) 10 MG tablet loratadine 10 mg tablet  Take 1 tablet every day by oral route in the morning.  (Patient not taking: Reported on 04/22/2021)     No facility-administered medications prior to visit.    No Known Allergies  ROS Review of Systems  Constitutional: Negative.   HENT:  Positive for congestion, ear pain, postnasal drip, rhinorrhea, sinus pressure, sinus pain, sneezing and sore throat. Negative for ear discharge, trouble swallowing and voice change.   Eyes: Negative.   Respiratory:  Positive for cough, chest tightness, shortness of breath and wheezing. Negative for apnea, choking and stridor.        Yellow mucus  cough started 1 mo ago, now wet was dry  Cardiovascular:  Positive for chest pain. Negative for palpitations and leg swelling.  Gastrointestinal:  Positive for abdominal pain. Negative for abdominal distention, nausea and vomiting.       Rlq pain  Genitourinary:  Positive for menstrual problem and pelvic pain. Negative for difficulty urinating, dysuria, flank pain and hematuria.       No menses since nov 14   Sex active uses condum   Musculoskeletal: Negative.  Negative for arthralgias and myalgias.  Skin: Negative.  Negative for rash.  Allergic/Immunologic: Negative.  Negative for environmental allergies and food allergies.  Neurological: Negative.  Negative for dizziness, syncope, weakness and headaches.  Hematological: Negative.  Negative for adenopathy. Does not bruise/bleed easily.  Psychiatric/Behavioral:  Positive for dysphoric mood. Negative for agitation, decreased concentration, hallucinations, self-injury, sleep disturbance and suicidal ideas. The patient is not nervous/anxious and is not hyperactive.      Objective:    Physical Exam Vitals reviewed.  Constitutional:      General: She is not in acute distress.    Appearance: Normal appearance. She is well-developed. She is not ill-appearing, toxic-appearing or diaphoretic.  HENT:     Head: Normocephalic and atraumatic.     Right Ear: Tympanic membrane and ear canal normal.     Left Ear: Tympanic  membrane and ear canal normal.     Nose: Congestion present. No nasal deformity, septal deviation, mucosal edema or rhinorrhea.     Right Sinus: No maxillary sinus tenderness or frontal sinus tenderness.     Left Sinus: No maxillary sinus tenderness or frontal sinus tenderness.     Comments: Purulence right greater than left nare    Mouth/Throat:     Pharynx: Uvula midline. Pharyngeal swelling and oropharyngeal exudate present.  Eyes:     General: No scleral icterus.    Extraocular Movements:     Right eye: Normal extraocular motion.     Left eye: Normal extraocular motion.     Conjunctiva/sclera: Conjunctivae normal.     Pupils: Pupils are equal, round, and reactive to light.  Neck:     Thyroid: No thyromegaly.  Vascular: No carotid bruit or JVD.     Trachea: Trachea normal. No tracheal tenderness or tracheal deviation.  Cardiovascular:     Rate and Rhythm: Normal rate and regular rhythm.     Chest Wall: PMI is not displaced.     Pulses: Normal pulses. No decreased pulses.     Heart sounds: Normal heart sounds, S1 normal and S2 normal. Heart sounds not distant. No murmur heard. No systolic murmur is present.  No diastolic murmur is present.    No friction rub. No gallop. No S3 or S4 sounds.  Pulmonary:     Effort: No tachypnea, accessory muscle usage or respiratory distress.     Breath sounds: Normal breath sounds. No stridor. No decreased breath sounds, wheezing, rhonchi or rales.  Chest:     Chest wall: No tenderness.  Abdominal:     General: Bowel sounds are normal. There is no distension.     Palpations: Abdomen is soft. Abdomen is not rigid.     Tenderness: There is abdominal tenderness in the right lower quadrant. There is no right CVA tenderness, left CVA tenderness, guarding or rebound. Negative signs include Murphy's sign, Rovsing's sign, McBurney's sign, psoas sign and obturator sign.     Hernia: No hernia is present.  Musculoskeletal:        General: Normal range  of motion.     Cervical back: Normal range of motion and neck supple. No edema, erythema or rigidity. No muscular tenderness. Normal range of motion.  Lymphadenopathy:     Head:     Right side of head: No submental or submandibular adenopathy.     Left side of head: No submental or submandibular adenopathy.     Cervical: No cervical adenopathy.  Skin:    General: Skin is warm and dry.     Coloration: Skin is not pale.     Findings: No rash.     Nails: There is no clubbing.  Neurological:     General: No focal deficit present.     Mental Status: She is alert and oriented to person, place, and time.     Sensory: No sensory deficit.  Psychiatric:        Mood and Affect: Mood normal.        Speech: Speech normal.        Behavior: Behavior normal.    BP 120/85    Pulse 68    Resp 16    SpO2 99%  Wt Readings from Last 3 Encounters:  12/25/20 159 lb 9.6 oz (72.4 kg)  09/17/20 155 lb 3.2 oz (70.4 kg)  10/27/18 148 lb 3.2 oz (67.2 kg)     Health Maintenance Due  Topic Date Due   Pneumococcal Vaccine 1719-42 Years old (1 - PCV) Never done   COVID-19 Vaccine (4 - Booster for Pfizer series) 07/31/2020   PAP SMEAR-Modifier  05/11/2021    There are no preventive care reminders to display for this patient.  No results found for: TSH Lab Results  Component Value Date   WBC 11.1 (H) 04/14/2018   HGB 8.8 (L) 04/14/2018   HCT 26.6 (L) 04/14/2018   MCV 87.2 04/14/2018   PLT 200 04/14/2018   Lab Results  Component Value Date   NA 130 (L) 04/14/2018   K 3.6 04/14/2018   CO2 19 (L) 04/14/2018   GLUCOSE 121 (H) 04/14/2018   BUN 6 04/14/2018   CREATININE 0.70 04/14/2018   CALCIUM 7.9 (L) 04/14/2018   ANIONGAP 9  04/14/2018   No results found for: CHOL No results found for: HDL No results found for: LDLCALC No results found for: TRIG No results found for: CHOLHDL Lab Results  Component Value Date   HGBA1C 5.4 09/24/2017      Assessment & Plan:   Problem List Items Addressed  This Visit       Respiratory   Asthma    Intermittent mild asthma now with mild flare owing to acute strep pharyngitis and sinusitis      Relevant Medications   Albuterol Sulfate (PROAIR RESPICLICK) 123XX123 (90 Base) MCG/ACT AEPB   Strep pharyngitis    Strep screen positive will give Augmentin twice daily for 7 days  Administer benzonatate as needed for cough        Other   Hyperlipidemia   Relevant Orders   Lipid panel   Irregular menses - Primary    Pregnancy test is negative she is having pelvic pain right lower quadrant she would benefit from a gynecologic evaluation she also needs a Pap smear  Gynecology referral sent      Relevant Orders   POCT urine pregnancy   Ambulatory referral to Gynecology   Acute cough    Due to acute tracheobronchitis and sinusitis  Begin Flonase daily and Augmentin for 7-day course      Relevant Orders   Rapid Strep A   Pelvic pain    As per irregular menses assessment referral to gynecology made      Relevant Orders   Ambulatory referral to Gynecology   Other Visit Diagnoses     Acute sore throat       Relevant Orders   Rapid Strep A   Encounter for health-related screening       Relevant Orders   Comprehensive metabolic panel   CBC with Differential/Platelet   Cervical cancer screening       Relevant Orders   Ambulatory referral to Gynecology       Meds ordered this encounter  Medications   amoxicillin-clavulanate (AUGMENTIN) 875-125 MG tablet    Sig: Take 1 tablet by mouth 2 (two) times daily for 7 days.    Dispense:  14 tablet    Refill:  0   fluticasone (FLONASE) 50 MCG/ACT nasal spray    Sig: Place 2 sprays into both nostrils daily.    Dispense:  16 g    Refill:  6   Albuterol Sulfate (PROAIR RESPICLICK) 123XX123 (90 Base) MCG/ACT AEPB    Sig: Inhale 2 puffs into the lungs every 6 (six) hours as needed.    Dispense:  1 each    Refill:  4   benzonatate (TESSALON) 100 MG capsule    Sig: Take 1 capsule (100 mg  total) by mouth 2 (two) times daily as needed for cough.    Dispense:  20 capsule    Refill:  0  31 minutes spent complex decision making is high delay in assessment due to language barrier accomplished by in person interpreter   Follow-up: Return in about 3 months (around 07/21/2021).    Asencion Noble, MD

## 2021-04-22 ENCOUNTER — Ambulatory Visit: Payer: Self-pay | Attending: Critical Care Medicine | Admitting: Critical Care Medicine

## 2021-04-22 ENCOUNTER — Other Ambulatory Visit: Payer: Self-pay

## 2021-04-22 ENCOUNTER — Encounter: Payer: Self-pay | Admitting: Critical Care Medicine

## 2021-04-22 VITALS — BP 120/85 | HR 68 | Resp 16

## 2021-04-22 DIAGNOSIS — E782 Mixed hyperlipidemia: Secondary | ICD-10-CM

## 2021-04-22 DIAGNOSIS — R102 Pelvic and perineal pain: Secondary | ICD-10-CM

## 2021-04-22 DIAGNOSIS — N926 Irregular menstruation, unspecified: Secondary | ICD-10-CM

## 2021-04-22 DIAGNOSIS — J4520 Mild intermittent asthma, uncomplicated: Secondary | ICD-10-CM

## 2021-04-22 DIAGNOSIS — Z139 Encounter for screening, unspecified: Secondary | ICD-10-CM

## 2021-04-22 DIAGNOSIS — R051 Acute cough: Secondary | ICD-10-CM | POA: Insufficient documentation

## 2021-04-22 DIAGNOSIS — J029 Acute pharyngitis, unspecified: Secondary | ICD-10-CM

## 2021-04-22 DIAGNOSIS — Z124 Encounter for screening for malignant neoplasm of cervix: Secondary | ICD-10-CM

## 2021-04-22 DIAGNOSIS — J020 Streptococcal pharyngitis: Secondary | ICD-10-CM

## 2021-04-22 LAB — POCT RAPID STREP A (OFFICE): Rapid Strep A Screen: POSITIVE — AB

## 2021-04-22 LAB — POCT URINE PREGNANCY: Preg Test, Ur: NEGATIVE

## 2021-04-22 MED ORDER — AMOXICILLIN-POT CLAVULANATE 875-125 MG PO TABS
1.0000 | ORAL_TABLET | Freq: Two times a day (BID) | ORAL | 0 refills | Status: AC
  Filled 2021-04-22: qty 14, 7d supply, fill #0

## 2021-04-22 MED ORDER — FLUTICASONE PROPIONATE 50 MCG/ACT NA SUSP
2.0000 | Freq: Every day | NASAL | 6 refills | Status: DC
  Filled 2021-04-22 – 2021-05-30 (×2): qty 16, 30d supply, fill #0

## 2021-04-22 MED ORDER — BENZONATATE 100 MG PO CAPS
100.0000 mg | ORAL_CAPSULE | Freq: Two times a day (BID) | ORAL | 0 refills | Status: DC | PRN
  Filled 2021-04-22: qty 20, 10d supply, fill #0

## 2021-04-22 MED ORDER — PROAIR RESPICLICK 108 (90 BASE) MCG/ACT IN AEPB
2.0000 | INHALATION_SPRAY | Freq: Four times a day (QID) | RESPIRATORY_TRACT | 4 refills | Status: DC | PRN
  Filled 2021-04-22 – 2021-05-30 (×2): qty 1, 25d supply, fill #0

## 2021-04-22 NOTE — Assessment & Plan Note (Signed)
Due to acute tracheobronchitis and sinusitis  Begin Flonase daily and Augmentin for 7-day course

## 2021-04-22 NOTE — Assessment & Plan Note (Signed)
As per irregular menses assessment referral to gynecology made

## 2021-04-22 NOTE — Patient Instructions (Signed)
Antibiotic sent to the pharmacy for strep throat  Your urine pregnancy test was negative, you are due a gynecologic exam will make a referral to gynecology you will also need a Pap smear  Medications for cough and an inhaler sent to our pharmacy  Pneumonia vaccine and flu vaccine given  Return to see Dr. Delford Field 3 months  Antibitico enviado a la farmacia para la faringitis estreptoccica  Su prueba de Psychiatrist en [place] fue negativa, debe realizarse un examen ginecolgico, lo derivarn a ginecologa, tambin necesitar una prueba de Papanicolaou.  Medicamentos para la tos y Advertising account executive enviado a nuestra farmacia  Vacuna contra la neumona y vacuna contra la gripe administrada  Volver a ver al Dr. Delford Field 3 meses

## 2021-04-22 NOTE — Assessment & Plan Note (Signed)
Intermittent mild asthma now with mild flare owing to acute strep pharyngitis and sinusitis

## 2021-04-22 NOTE — Assessment & Plan Note (Signed)
Pregnancy test is negative she is having pelvic pain right lower quadrant she would benefit from a gynecologic evaluation she also needs a Pap smear  Gynecology referral sent

## 2021-04-22 NOTE — Assessment & Plan Note (Addendum)
Strep screen positive will give Augmentin twice daily for 7 days  Administer benzonatate as needed for cough

## 2021-04-23 ENCOUNTER — Other Ambulatory Visit: Payer: Self-pay

## 2021-04-23 ENCOUNTER — Other Ambulatory Visit: Payer: Self-pay | Admitting: Critical Care Medicine

## 2021-04-23 LAB — CBC WITH DIFFERENTIAL/PLATELET
Basophils Absolute: 0.1 10*3/uL (ref 0.0–0.2)
Basos: 1 %
EOS (ABSOLUTE): 0.4 10*3/uL (ref 0.0–0.4)
Eos: 7 %
Hematocrit: 37.9 % (ref 34.0–46.6)
Hemoglobin: 12.5 g/dL (ref 11.1–15.9)
Immature Grans (Abs): 0 10*3/uL (ref 0.0–0.1)
Immature Granulocytes: 0 %
Lymphocytes Absolute: 2.4 10*3/uL (ref 0.7–3.1)
Lymphs: 39 %
MCH: 26.4 pg — ABNORMAL LOW (ref 26.6–33.0)
MCHC: 33 g/dL (ref 31.5–35.7)
MCV: 80 fL (ref 79–97)
Monocytes Absolute: 0.5 10*3/uL (ref 0.1–0.9)
Monocytes: 8 %
Neutrophils Absolute: 2.7 10*3/uL (ref 1.4–7.0)
Neutrophils: 45 %
Platelets: 285 10*3/uL (ref 150–450)
RBC: 4.73 x10E6/uL (ref 3.77–5.28)
RDW: 12.1 % (ref 11.7–15.4)
WBC: 6.1 10*3/uL (ref 3.4–10.8)

## 2021-04-23 LAB — COMPREHENSIVE METABOLIC PANEL
ALT: 18 IU/L (ref 0–32)
AST: 23 IU/L (ref 0–40)
Albumin/Globulin Ratio: 1.8 (ref 1.2–2.2)
Albumin: 4.8 g/dL (ref 3.8–4.8)
Alkaline Phosphatase: 75 IU/L (ref 44–121)
BUN/Creatinine Ratio: 15 (ref 9–23)
BUN: 10 mg/dL (ref 6–24)
Bilirubin Total: 0.3 mg/dL (ref 0.0–1.2)
CO2: 24 mmol/L (ref 20–29)
Calcium: 9.5 mg/dL (ref 8.7–10.2)
Chloride: 101 mmol/L (ref 96–106)
Creatinine, Ser: 0.66 mg/dL (ref 0.57–1.00)
Globulin, Total: 2.7 g/dL (ref 1.5–4.5)
Glucose: 93 mg/dL (ref 70–99)
Potassium: 4.5 mmol/L (ref 3.5–5.2)
Sodium: 139 mmol/L (ref 134–144)
Total Protein: 7.5 g/dL (ref 6.0–8.5)
eGFR: 112 mL/min/{1.73_m2} (ref >59)

## 2021-04-23 LAB — LIPID PANEL
Chol/HDL Ratio: 4.3 ratio (ref 0.0–4.4)
Cholesterol, Total: 228 mg/dL — ABNORMAL HIGH (ref 100–199)
HDL: 53 mg/dL (ref >39)
LDL Chol Calc (NIH): 152 mg/dL — ABNORMAL HIGH (ref 0–99)
Triglycerides: 131 mg/dL (ref 0–149)
VLDL Cholesterol Cal: 23 mg/dL (ref 5–40)

## 2021-04-23 MED ORDER — ATORVASTATIN CALCIUM 10 MG PO TABS
10.0000 mg | ORAL_TABLET | Freq: Every day | ORAL | 3 refills | Status: DC
  Filled 2021-04-23 – 2021-05-23 (×2): qty 30, 30d supply, fill #0

## 2021-04-24 ENCOUNTER — Telehealth: Payer: Self-pay

## 2021-04-24 NOTE — Telephone Encounter (Signed)
Pt was called and is aware of results, DOB was confirmed    Interpreter:Laura Number:  [phone]

## 2021-04-24 NOTE — Telephone Encounter (Signed)
-----   Message from Storm Frisk, MD sent at 04/23/2021  7:51 AM EST ----- Let pt know all labs normal except high cholesterol , I sent atorvastatin to our pharmacy

## 2021-05-23 ENCOUNTER — Other Ambulatory Visit: Payer: Self-pay

## 2021-05-24 ENCOUNTER — Other Ambulatory Visit: Payer: Self-pay

## 2021-05-30 ENCOUNTER — Other Ambulatory Visit: Payer: Self-pay

## 2021-05-31 ENCOUNTER — Other Ambulatory Visit: Payer: Self-pay

## 2021-06-07 ENCOUNTER — Other Ambulatory Visit: Payer: Self-pay

## 2021-07-01 ENCOUNTER — Other Ambulatory Visit: Payer: Self-pay

## 2021-07-03 ENCOUNTER — Other Ambulatory Visit: Payer: Self-pay | Admitting: *Deleted

## 2021-07-03 ENCOUNTER — Other Ambulatory Visit: Payer: Self-pay

## 2021-07-03 ENCOUNTER — Encounter (INDEPENDENT_AMBULATORY_CARE_PROVIDER_SITE_OTHER): Payer: Self-pay

## 2021-07-03 DIAGNOSIS — Z124 Encounter for screening for malignant neoplasm of cervix: Secondary | ICD-10-CM

## 2021-07-03 NOTE — Progress Notes (Signed)
Patient: Emily Andrade           ?Date of Birth: Mar 03, 1981           ?MRN: [id number] ?Visit Date: 07/03/2021 ?PCP: Emily Frisk, MD ? ?Cervical Cancer Screening ?Do you smoke?: No ?Have you ever had or been told you have an allergy to latex products?: No ?Marital status: Single ?Date of last pap smear: 2-5 yrs ago (05/11/2018 Pap/HPV-Negative ([facility])) ?Date of last menstrual period: 02/15/21 ?Number of pregnancies: 2 ?Number of births: 2 ?Have you ever had any of the following? ?Hysterectomy: No ?Tubal ligation (tubes tied): No ?Abnormal bleeding: Yes ?Abnormal pap smear: No ?Venereal warts: No ?A sex partner with venereal warts: No ?A high risk* sex partner: No ? ?Cervical Exam ? ?Abnormal Observations: Normal Exam ?Recommendations: Last Pap smear was 05/11/2018 at [facility] at [facility] and was normal with negative HPV. Per patient has no history of an abnormal Pap smear. Last Pap smear result is available in Epic. Let patient know if today's Pap smear is normal and HPV negative that her next Pap smear will be due in 5 years. Informed patient will follow up with her within the next couple of weeks with results of her Pap smear by phone. Patient verbalized understanding. ? ? ?Spanish interpreter Emily Andrade from [facility] provided. ? ?Patient's History ?Patient Active Problem List  ? Diagnosis Date Noted  ? Irregular menses 04/22/2021  ? Acute cough 04/22/2021  ? Pelvic pain 04/22/2021  ? Strep pharyngitis 04/22/2021  ? Hyperlipidemia 09/18/2020  ? Language barrier 11/17/2017  ? Depression   ? Asthma   ? ?Past Medical History:  ?Diagnosis Date  ? Anemia   ? Asthma   ? childhood  ? Depression   ? Frequent UTI   ? Hyperlipidemia   ?  ?Family History  ?Problem Relation Age of Onset  ? Hypertension Mother   ? Heart disease Father   ?     MI  ? Diabetes Father   ? Diabetes Sister   ? Birth defects Other   ?     Downs syndrome, cleft lip and palate  ?   ?Social History  ? ?Occupational History  ? Not on file  ?Tobacco Use  ? Smoking status: Never  ? Smokeless tobacco: Never  ?Vaping Use  ? Vaping Use: Never used  ?Substance and Sexual Activity  ? Alcohol use: No  ?  Alcohol/week: 0.0 standard drinks  ? Drug use: No  ? Sexual activity: Yes  ?  Birth control/protection: None  ? ?

## 2021-07-04 LAB — CYTOLOGY - PAP
Comment: NEGATIVE
Diagnosis: NEGATIVE
High risk HPV: NEGATIVE

## 2021-07-08 ENCOUNTER — Telehealth: Payer: Self-pay

## 2021-07-08 NOTE — Telephone Encounter (Signed)
-----   Message from Storm Frisk, MD sent at 07/05/2021  4:57 PM EST ----- ?Let pt know pap smear was negative  recheck in three years ?

## 2021-07-08 NOTE — Telephone Encounter (Signed)
Pt was called and is aware of results, DOB was confirmed  ? ? ?Interpreter [id number] ? ?

## 2021-07-12 ENCOUNTER — Other Ambulatory Visit: Payer: Self-pay

## 2021-07-21 NOTE — Progress Notes (Signed)
? ?Established Patient Office Visit ? ?Subjective:  ?Patient ID: Emily Andrade, female    DOB: 21-Jun-1980  Age: 41 y.o. MRN: [id number] ? ?CC:  ?Chief Complaint  ?Patient presents with  ? Medication Refill  ? ? ?HPI ? ?04/22/2021 ?Emily Andrade presents for primary care follow-up and patient is accompanied with language resource in person interpreter Byrd Hesselbach for Spanish ?Patient notes increased onset of chest tightness and anterior back pain with increased cough productive of thick yellow mucus and a severe sore throat that is been going on for several days.  Patient also has been missing her periods last period was early November.  She has a 18-year-old and an 101-year-old at home and does not wish to have further children only birth control is that her partner uses a condom.  Patient also notes increased stress and depression during the December months around the holidays but because of loss of family members this past year that she was close to. ?  ?The patient is not suicidal at this time.  Patient complains of right-sided lower abdominal pain associated with irregular periods ? ?07/22/21 ?Emily Andrade returns in follow-up from December visit.  The visit was assisted video interpreter Jos? #[id number] for Spanish interpretation. ?Since the last visit when she had a strep infection her throat is much better.  Her cough is now resolved.  She uses albuterol as needed Flonase as needed.  She has a pending gynecology appointment but did have a Pap smear done through the cancer screening program which was negative.  We started her on low-dose atorvastatin for elevated LDL however she had severe adverse side effects with right upper quadrant pain.  She stopped the medication the symptoms resolved themselves.  Patient's been complaining of a foul odor from her vaginal area with discharge.  She has not had any menstrual period since last October.  Pregnancy test have been negative.  She has no other  complaints at this time. ? ? ? ? ?Past Medical History:  ?Diagnosis Date  ? Anemia   ? Asthma   ? childhood  ? Depression   ? Frequent UTI   ? Hyperlipidemia   ? ? ?Past Surgical History:  ?Procedure Laterality Date  ? CESAREAN SECTION N/A 04/08/2013  ? Procedure: CESAREAN SECTION;  Surgeon: Reva Bores, MD;  Location: [facility];  Service: Obstetrics;  Laterality: N/A;  ? CESAREAN SECTION N/A 04/13/2018  ? Procedure: CESAREAN SECTION;  Surgeon: Levie Heritage, DO;  Location: [facility] BIRTHING SUIT[facility];  Service: Obstetrics;  Laterality: N/A;  ? ? ?Family History  ?Problem Relation Age of Onset  ? Hypertension Mother   ? Heart disease Father   ?     MI  ? Diabetes Father   ? Diabetes Sister   ? Birth defects Other   ?     Downs syndrome, cleft lip and palate  ? ? ?Social History  ? ?Socioeconomic History  ? Marital status: Single  ?  Spouse name: Not on file  ? Number of children: 2  ? Years of education: Not on file  ? Highest education level: 6th grade  ?Occupational History  ? Not on file  ?Tobacco Use  ? Smoking status: Never  ? Smokeless tobacco: Never  ?Vaping Use  ? Vaping Use: Never used  ?Substance and Sexual Activity  ? Alcohol use: No  ?  Alcohol/week: 0.0 standard drinks  ? Drug use: No  ? Sexual activity: Yes  ?  Birth control/protection: None  ?  Other Topics Concern  ? Not on file  ?Social History Narrative  ? Not on file  ? ?Social Determinants of Health  ? ?Financial Resource Strain: Not on file  ?Food Insecurity: No Food Insecurity  ? Worried About Programme researcher, broadcasting/film/videounning Out of Food in the Last Year: Never true  ? Ran Out of Food in the Last Year: Never true  ?Transportation Needs: No Transportation Needs  ? Lack of Transportation (Medical): No  ? Lack of Transportation (Non-Medical): No  ?Physical Activity: Not on file  ?Stress: Not on file  ?Social Connections: Not on file  ?Intimate Partner Violence: Not on file  ? ? ?Outpatient Medications Prior to Visit  ?Medication Sig Dispense Refill  ? Albuterol Sulfate (PROAIR  RESPICLICK) 108 (90 Base) MCG/ACT AEPB Inhale 2 puffs into the lungs every 6 (six) hours as needed. 1 each 4  ? atorvastatin (LIPITOR) 10 MG tablet Take 1 tablet (10 mg total) by mouth daily. 90 tablet 3  ? benzonatate (TESSALON) 100 MG capsule Take 1 capsule (100 mg total) by mouth 2 (two) times daily as needed for cough. 20 capsule 0  ? fluticasone (FLONASE) 50 MCG/ACT nasal spray Place 2 sprays into both nostrils daily. 16 g 6  ? ?No facility-administered medications prior to visit.  ? ? ?Allergies  ?Allergen Reactions  ? Statins Other (See Comments)  ?  Abdominal pain  ? ? ?ROS ?Review of Systems  ?Constitutional: Negative.   ?HENT: Negative.  Negative for ear pain, postnasal drip, rhinorrhea, sinus pressure, sore throat, trouble swallowing and voice change.   ?Eyes: Negative.   ?Respiratory: Negative.  Negative for apnea, cough, choking, chest tightness, shortness of breath, wheezing and stridor.   ?Cardiovascular: Negative.  Negative for chest pain, palpitations and leg swelling.  ?Gastrointestinal: Negative.  Negative for abdominal distention, abdominal pain, nausea and vomiting.  ?Genitourinary:  Positive for vaginal discharge.  ?     Amenorrhea  ?Musculoskeletal: Negative.  Negative for arthralgias and myalgias.  ?Skin: Negative.  Negative for rash.  ?Allergic/Immunologic: Negative.  Negative for environmental allergies and food allergies.  ?Neurological: Negative.  Negative for dizziness, syncope, weakness and headaches.  ?Hematological: Negative.  Negative for adenopathy. Does not bruise/bleed easily.  ?Psychiatric/Behavioral: Negative.  Negative for agitation and sleep disturbance. The patient is not nervous/anxious.   ? ?  ?Objective:  ?  ?Physical Exam ?Vitals reviewed.  ?Constitutional:   ?   Appearance: Normal appearance. She is well-developed. She is obese. She is not diaphoretic.  ?HENT:  ?   Head: Normocephalic and atraumatic.  ?   Nose: No nasal deformity, septal deviation, mucosal edema or  rhinorrhea.  ?   Right Sinus: No maxillary sinus tenderness or frontal sinus tenderness.  ?   Left Sinus: No maxillary sinus tenderness or frontal sinus tenderness.  ?   Mouth/Throat:  ?   Pharynx: No oropharyngeal exudate.  ?Eyes:  ?   General: No scleral icterus. ?   Conjunctiva/sclera: Conjunctivae normal.  ?   Pupils: Pupils are equal, round, and reactive to light.  ?Neck:  ?   Thyroid: No thyromegaly.  ?   Vascular: No carotid bruit or JVD.  ?   Trachea: Trachea normal. No tracheal tenderness or tracheal deviation.  ?Cardiovascular:  ?   Rate and Rhythm: Normal rate and regular rhythm.  ?   Chest Wall: PMI is not displaced.  ?   Pulses: Normal pulses. No decreased pulses.  ?   Heart sounds: Normal heart sounds, S1 normal and S2  normal. Heart sounds not distant. No murmur heard. ?No systolic murmur is present.  ?No diastolic murmur is present.  ?  No friction rub. No gallop. No S3 or S4 sounds.  ?Pulmonary:  ?   Effort: Pulmonary effort is normal. No tachypnea, accessory muscle usage or respiratory distress.  ?   Breath sounds: Normal breath sounds. No stridor. No decreased breath sounds, wheezing, rhonchi or rales.  ?Chest:  ?   Chest wall: No tenderness.  ?Abdominal:  ?   General: Bowel sounds are normal. There is no distension.  ?   Palpations: Abdomen is soft. Abdomen is not rigid.  ?   Tenderness: There is no abdominal tenderness. There is no guarding or rebound.  ?Musculoskeletal:     ?   General: Normal range of motion.  ?   Cervical back: Normal range of motion and neck supple. No edema, erythema or rigidity. No muscular tenderness. Normal range of motion.  ?Lymphadenopathy:  ?   Head:  ?   Right side of head: No submental or submandibular adenopathy.  ?   Left side of head: No submental or submandibular adenopathy.  ?   Cervical: No cervical adenopathy.  ?Skin: ?   General: Skin is warm and dry.  ?   Coloration: Skin is not pale.  ?   Findings: No rash.  ?   Nails: There is no clubbing.  ?Neurological:   ?   Mental Status: She is alert and oriented to person, place, and time.  ?   Sensory: No sensory deficit.  ?Psychiatric:     ?   Mood and Affect: Mood normal.     ?   Speech: Speech normal.     ?   Behavior: Behav

## 2021-07-22 ENCOUNTER — Other Ambulatory Visit: Payer: Self-pay

## 2021-07-22 ENCOUNTER — Ambulatory Visit: Payer: Self-pay | Attending: Critical Care Medicine | Admitting: Critical Care Medicine

## 2021-07-22 ENCOUNTER — Other Ambulatory Visit (HOSPITAL_COMMUNITY)
Admission: RE | Admit: 2021-07-22 | Discharge: 2021-07-22 | Disposition: A | Discharge status: Home / Self Care | Payer: No Typology Code available for payment source | Source: Ambulatory Visit | Attending: Critical Care Medicine | Admitting: Critical Care Medicine

## 2021-07-22 ENCOUNTER — Encounter: Payer: Self-pay | Admitting: Critical Care Medicine

## 2021-07-22 VITALS — BP 113/80 | HR 73 | Wt 165.2 lb

## 2021-07-22 DIAGNOSIS — J020 Streptococcal pharyngitis: Secondary | ICD-10-CM

## 2021-07-22 DIAGNOSIS — J4520 Mild intermittent asthma, uncomplicated: Secondary | ICD-10-CM

## 2021-07-22 DIAGNOSIS — N898 Other specified noninflammatory disorders of vagina: Secondary | ICD-10-CM | POA: Insufficient documentation

## 2021-07-22 DIAGNOSIS — R051 Acute cough: Secondary | ICD-10-CM

## 2021-07-22 DIAGNOSIS — F3342 Major depressive disorder, recurrent, in full remission: Secondary | ICD-10-CM

## 2021-07-22 DIAGNOSIS — N926 Irregular menstruation, unspecified: Secondary | ICD-10-CM

## 2021-07-22 DIAGNOSIS — Z888 Allergy status to other drugs, medicaments and biological substances status: Secondary | ICD-10-CM

## 2021-07-22 DIAGNOSIS — R102 Pelvic and perineal pain: Secondary | ICD-10-CM

## 2021-07-22 DIAGNOSIS — E782 Mixed hyperlipidemia: Secondary | ICD-10-CM

## 2021-07-22 MED ORDER — PROAIR RESPICLICK 108 (90 BASE) MCG/ACT IN AEPB
2.0000 | INHALATION_SPRAY | Freq: Four times a day (QID) | RESPIRATORY_TRACT | 4 refills | Status: DC | PRN
  Filled 2021-07-22: qty 1, 25d supply, fill #0

## 2021-07-22 MED ORDER — FLUTICASONE PROPIONATE 50 MCG/ACT NA SUSP
2.0000 | Freq: Every day | NASAL | 6 refills | Status: DC
  Filled 2021-07-22: qty 16, 30d supply, fill #0
  Filled 2021-10-01: qty 16, 30d supply, fill #1

## 2021-07-22 NOTE — Assessment & Plan Note (Signed)
Improved with counseling. 

## 2021-07-22 NOTE — Assessment & Plan Note (Signed)
Discontinue atorvastatin

## 2021-07-22 NOTE — Assessment & Plan Note (Signed)
This has resolved.

## 2021-07-22 NOTE — Patient Instructions (Signed)
Follow healthy diet as per handout we gave you today ? ?Walk for 30 minutes 4-5 times a week ? ?Referral to gynecology has been made they will call you ? ?Cervical vaginal swab was obtained today we will call you results ? ?Refills on your Flonase and albuterol inhaler were sent to the pharmacy ? ?Return to see Dr. Delford Field 5 months ? ?Siga una dieta saludable seg?n el folleto que le dimos hoy. ? ?Camine durante 30 minutos 4-5 veces a la semana ? ?Se ha hecho derivaci?n a ginecolog?a ellos te llamar?n ? ?Se obtuvo hisopado vaginal cervicouterino hoy le llamaremos resultados ? ?Las Radiation protection practitioner de su inhalador Flonase y albuterol fueron enviadas a la Corporate investment banker. ? ?Volver a ver al Dr. Delford Field 5 meses ?

## 2021-07-22 NOTE — Assessment & Plan Note (Signed)
Very mild in nature gynecology consult pending note she now has amenorrhea ?

## 2021-07-22 NOTE — Assessment & Plan Note (Signed)
Obtain cervical vaginal swab 

## 2021-07-22 NOTE — Assessment & Plan Note (Signed)
We will have the patient follow a healthy diet I discontinued further atorvastatin ?

## 2021-07-22 NOTE — Assessment & Plan Note (Signed)
Now without any periods since October of this past year ?

## 2021-07-22 NOTE — Assessment & Plan Note (Signed)
Stable at this time continue albuterol as needed 

## 2021-07-23 ENCOUNTER — Telehealth: Payer: Self-pay

## 2021-07-23 LAB — CERVICOVAGINAL ANCILLARY ONLY
Bacterial Vaginitis (gardnerella): NEGATIVE
Candida Glabrata: NEGATIVE
Candida Vaginitis: NEGATIVE
Chlamydia: NEGATIVE
Comment: NEGATIVE
Comment: NEGATIVE
Comment: NEGATIVE
Comment: NEGATIVE
Comment: NEGATIVE
Comment: NORMAL
Neisseria Gonorrhea: NEGATIVE
Trichomonas: NEGATIVE

## 2021-07-23 NOTE — Telephone Encounter (Signed)
-----   Message from Storm Frisk, MD sent at 07/23/2021 11:59 AM EDT ----- ?Let pt know vag swab negative for any infection ?

## 2021-07-23 NOTE — Telephone Encounter (Signed)
Pt was called and is aware of results, DOB was confirmed.  ? ? ?Interpreter [id number] Vicente Males  ?

## 2021-08-26 ENCOUNTER — Telehealth: Payer: Self-pay | Admitting: Critical Care Medicine

## 2021-08-26 NOTE — Telephone Encounter (Signed)
Pt is calling to check on the status of gynecology consult. Pt was seen in OV with Dr. Delford Field 3/23 ?[phone] ?

## 2021-08-27 NOTE — Telephone Encounter (Signed)
Called and pt is aware  ? ?Interpreter ID# Bonnita Nasuti [phone] ? ?

## 2021-08-27 NOTE — Telephone Encounter (Signed)
Routing to patient's PCP. It looks like the referral was authorized. Patient can call [facility]  - Ph# [phone] to schedule an appointment.  ?

## 2021-09-26 ENCOUNTER — Other Ambulatory Visit: Payer: Self-pay

## 2021-10-01 ENCOUNTER — Other Ambulatory Visit: Payer: Self-pay

## 2021-10-01 ENCOUNTER — Other Ambulatory Visit: Payer: Self-pay | Admitting: Critical Care Medicine

## 2021-10-02 ENCOUNTER — Other Ambulatory Visit: Payer: Self-pay

## 2021-10-11 ENCOUNTER — Encounter: Payer: Self-pay | Admitting: *Deleted

## 2021-10-14 ENCOUNTER — Ambulatory Visit: Payer: No Typology Code available for payment source | Admitting: Critical Care Medicine

## 2021-10-22 NOTE — Congregational Nurse Program (Signed)
  Dept: [phone]   Congregational Nurse Program Note  Date of Encounter: 10/11/2021  Past Medical History: Past Medical History:  Diagnosis Date   Anemia    Asthma    childhood   Depression    Frequent UTI    Hyperlipidemia     Encounter Details:  CNP Questionnaire - 10/11/21 1000       Questionnaire   Do you give verbal consent to treat you today? Yes    Location Patient Served  [employer]    Visit Setting Home    Patient Status Immigrant    Insurance Uninsured ([place] Card/Care Connects/Self-Pay)    Insurance Referral N/A    Medication Have Medication Insecurities    Medical Provider Yes    Screening Referrals N/A    Medical Referral N/A    Medical Appointment Made N/A    Food Have Food Insecurities    Transportation N/A    Housing/Utilities N/A    Interpersonal Safety N/A    Intervention Blood pressure;Counsel;Educate;Support    ED Visit Averted Yes    Life-Saving Intervention Made N/A            Home visit made with an interpreter.  Client is having headaches, left leg numbness, left neck pain, left arm pain an numbness intermittently.  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  She also has right abdominal pain.  And she expresses the desire for a dental referral/appointment.  She does have her orange card which is active.  I will contact orange card services regarding a dental referral.  Will follow up with client as desired.  Roderic Palau, RN, MSN, CNP [phone] Office [phone] Cell

## 2021-11-11 ENCOUNTER — Other Ambulatory Visit: Payer: Self-pay

## 2021-11-11 DIAGNOSIS — Z1231 Encounter for screening mammogram for malignant neoplasm of breast: Secondary | ICD-10-CM

## 2021-11-11 NOTE — Addendum Note (Signed)
Addended by: Narda Rutherford on: 11/11/2021 03:47 PM   Modules accepted: Orders

## 2022-01-01 ENCOUNTER — Other Ambulatory Visit: Payer: Self-pay | Admitting: Physician Assistant

## 2022-01-01 DIAGNOSIS — R1030 Lower abdominal pain, unspecified: Secondary | ICD-10-CM

## 2022-01-01 DIAGNOSIS — N926 Irregular menstruation, unspecified: Secondary | ICD-10-CM

## 2022-01-07 ENCOUNTER — Ambulatory Visit
Admission: RE | Admit: 2022-01-07 | Discharge: 2022-01-07 | Disposition: A | Discharge status: Home / Self Care | Payer: No Typology Code available for payment source | Source: Ambulatory Visit | Attending: Critical Care Medicine | Admitting: Critical Care Medicine

## 2022-01-07 DIAGNOSIS — Z1231 Encounter for screening mammogram for malignant neoplasm of breast: Secondary | ICD-10-CM

## 2022-01-08 NOTE — Progress Notes (Signed)
Let pt know mammogram was normal recheck on year

## 2022-01-10 ENCOUNTER — Telehealth: Payer: Self-pay

## 2022-01-10 NOTE — Telephone Encounter (Signed)
Pt was called and is aware of results, DOB was confirmed.   Interpreter [phone]

## 2022-01-10 NOTE — Telephone Encounter (Signed)
-----   Message from Storm Frisk, MD sent at 01/08/2022  5:48 AM EDT ----- Let pt know mammogram was normal recheck on year

## 2022-01-13 ENCOUNTER — Ambulatory Visit
Admission: RE | Admit: 2022-01-13 | Discharge: 2022-01-13 | Disposition: A | Discharge status: Home / Self Care | Payer: No Typology Code available for payment source | Source: Ambulatory Visit | Attending: Physician Assistant | Admitting: Physician Assistant

## 2022-01-13 DIAGNOSIS — N926 Irregular menstruation, unspecified: Secondary | ICD-10-CM

## 2022-01-13 DIAGNOSIS — R1030 Lower abdominal pain, unspecified: Secondary | ICD-10-CM

## 2022-02-14 ENCOUNTER — Other Ambulatory Visit: Payer: Self-pay

## 2022-02-14 MED ORDER — PROAIR RESPICLICK 108 (90 BASE) MCG/ACT IN AEPB: INHALATION_SPRAY | RESPIRATORY_TRACT | 1 refills | Status: DC

## 2022-03-03 ENCOUNTER — Encounter: Payer: Self-pay | Admitting: *Deleted

## 2022-04-09 ENCOUNTER — Encounter: Payer: Self-pay | Admitting: *Deleted

## 2022-04-14 ENCOUNTER — Encounter: Payer: Self-pay | Admitting: Obstetrics and Gynecology

## 2022-04-14 ENCOUNTER — Ambulatory Visit (INDEPENDENT_AMBULATORY_CARE_PROVIDER_SITE_OTHER): Payer: Self-pay | Admitting: Obstetrics and Gynecology

## 2022-04-14 ENCOUNTER — Other Ambulatory Visit: Payer: Self-pay

## 2022-04-14 VITALS — BP 111/59 | HR 67 | Ht 61.0 in | Wt 161.8 lb

## 2022-04-14 DIAGNOSIS — N926 Irregular menstruation, unspecified: Secondary | ICD-10-CM

## 2022-04-14 DIAGNOSIS — N949 Unspecified condition associated with female genital organs and menstrual cycle: Secondary | ICD-10-CM

## 2022-04-14 DIAGNOSIS — Z1331 Encounter for screening for depression: Secondary | ICD-10-CM

## 2022-04-14 MED ORDER — DESOGESTREL-ETHINYL ESTRADIOL 0.15-30 MG-MCG PO TABS: 1.0000 | ORAL_TABLET | Freq: Every day | ORAL | 11 refills | Status: DC

## 2022-04-14 NOTE — Progress Notes (Signed)
Ms Emily Andrade presents for eval of adnexal cyst noted o n U/S Pt was c/o adnexal pain at the time of the U/S. This has now resolved Reports cycles are irregular Condoms for contraception.Sexual active without problems C/S x 2 Pap smear UTD Denies any bowel or bladder dysfunction  PE AF VSS Chaperone present  Lungs clear Heart RRR Abd soft + BS GU Nl EGBUS, bladder non tender, uterus small, mobile, non tender, no masses   A/P Irregular cycles        Adnexal cyst  Suspect cyst and prior pain related to irregular cycles Discussed hormonal manipulation of her cycles Pt agreeable U/R/B reviewed Will start OCP's today and check GYN U/S F/U in 4 months Live interrupter used during today's visit

## 2022-04-14 NOTE — Addendum Note (Signed)
Addended by: Cinda Quest A on: 04/14/2022 09:25 AM   Modules accepted: Orders

## 2022-04-29 ENCOUNTER — Ambulatory Visit (HOSPITAL_COMMUNITY): Payer: Self-pay

## 2022-05-08 ENCOUNTER — Other Ambulatory Visit: Payer: Self-pay

## 2022-05-08 MED ORDER — CETIRIZINE HCL 10 MG PO TABS
10.0000 mg | ORAL_TABLET | Freq: Every day | ORAL | 3 refills | Status: DC
  Filled 2022-05-08: qty 90, 90d supply, fill #0

## 2022-05-08 MED ORDER — FLUTICASONE PROPIONATE 50 MCG/ACT NA SUSP
1.0000 | Freq: Every day | NASAL | 1 refills | Status: DC
  Filled 2022-05-08: qty 16, 60d supply, fill #0

## 2022-05-08 MED ORDER — AMOXICILLIN-POT CLAVULANATE 875-125 MG PO TABS
1.0000 | ORAL_TABLET | Freq: Two times a day (BID) | ORAL | 0 refills | Status: DC
  Filled 2022-05-08: qty 14, 7d supply, fill #0

## 2022-05-27 ENCOUNTER — Telehealth: Payer: Self-pay | Admitting: Family Medicine

## 2022-05-27 ENCOUNTER — Other Ambulatory Visit: Payer: Self-pay

## 2022-05-27 NOTE — Congregational Nurse Program (Signed)
  Dept: [phone]   Congregational Nurse Program Note  Date of Encounter: 05/27/2022  Past Medical History: Past Medical History:  Diagnosis Date   Anemia    Asthma    childhood   Depression    Frequent UTI    Hyperlipidemia     Encounter Details:  CNP Questionnaire - 05/27/22 1443       Questionnaire   Ask client: Do you give verbal consent for me to treat you today? 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    Visit Setting with Client Organization    Patient Status Migrant    Insurance Uninsured ([place] Card/Care Connects/Self-Pay/Medicaid Family Planning)    Insurance/Financial Assistance Referral N/A    Medication N/A    Medical Provider Yes    Screening Referrals Made Made Appointment for Client    Medical Referrals Made N/A    Medical Appointment Made [facility] P[facility]clinic    Recently w/o PCP, now 1st time PCP visit completed due to CNs referral or appointment made N/A    Food N/A    Transportation N/A    Housing/Utilities N/A    Interpersonal Safety N/A    Interventions Navigate Healthcare System;Counsel;Reviewed Medications    Abnormal to Normal Screening Since Last CN Visit N/A    Screenings CN Performed N/A    Sent Client to Lab for: N/A    Did client attend any of the following based off CNs referral or appointments made? N/A    ED Visit Averted N/A    Life-Saving Intervention Made N/A            112/80 blood pressure, pulse 80, stated no pain. Client here d/t vomiting and dizziness from her OCP. Appt set up for client to be seen at [facility] GYN[facility]. Nurse at [place] for [facility] will call client for further instructions regarding symptoms. Medication appointment made for medication refill and secondary appointment with Dr.Wright.

## 2022-05-27 NOTE — Telephone Encounter (Signed)
Paitent states she is having trouble with her ocps, they are causing her issues.

## 2022-05-28 ENCOUNTER — Other Ambulatory Visit: Payer: Self-pay | Admitting: Pharmacist

## 2022-05-28 ENCOUNTER — Other Ambulatory Visit: Payer: Self-pay

## 2022-05-28 MED ORDER — ALBUTEROL SULFATE HFA 108 (90 BASE) MCG/ACT IN AERS
2.0000 | INHALATION_SPRAY | Freq: Four times a day (QID) | RESPIRATORY_TRACT | 0 refills | Status: DC | PRN
  Filled 2022-05-28: qty 6.7, 25d supply, fill #0

## 2022-05-28 NOTE — Telephone Encounter (Addendum)
Called pt w/Emily Andrade - interpreter and discussed her concern. She stated that she started the pills as prescribed and took all of the first pack. During that time she did not feel well - very tired and nauseous. She started the second pack on 1/21 and the next night she woke up feeling like her heart was going to "beat out of her chest." She also had nausea and threw up. She has stopped taking the pills and is feeling better. I advised that I will notify Dr. Rip Harbour and she will be called with his recommendation - message sent to him. Pt stated that she is open to trying a different OCP if he recommends. Pt was advised to use a condom with each intercourse if she does not want to become pregnant. Pt voiced understanding.   1/25  1335 Called pt with Emily Andrade-interpreter and advised her of recommendation from Dr. Rip Harbour. He has prescribed alternate OCP ([facility] Estrin) which will be sent to her pharmacy. He would like her to begin the new pills on the first [name] of her next cycle. He would like her to have follow up with him after 3 months of the new pills. She will need to call to schedule the appointment once she has started the pills. Pt voiced understanding and agreed to plan of care.

## 2022-05-29 MED ORDER — NORETHIN-ETH ESTRAD-FE BIPHAS 1 MG-10 MCG / 10 MCG PO TABS
1.0000 | ORAL_TABLET | Freq: Every day | ORAL | 5 refills | Status: DC
  Filled 2022-05-29: qty 28, 28d supply, fill #0

## 2022-05-29 NOTE — Addendum Note (Signed)
Addended by: Langston Reusing on: 05/29/2022 05:06 PM   Modules accepted: Orders

## 2022-05-30 ENCOUNTER — Encounter: Payer: Self-pay | Admitting: *Deleted

## 2022-05-30 ENCOUNTER — Other Ambulatory Visit: Payer: Self-pay

## 2022-05-30 NOTE — Congregational Nurse Program (Signed)
  Dept: [phone]   Congregational Nurse Program Note  Date of Encounter: 05/30/2022  Past Medical History: Past Medical History:  Diagnosis Date   Anemia    Asthma    childhood   Depression    Frequent UTI    Hyperlipidemia     Encounter Details:  CNP Questionnaire - 05/30/22 1000       Questionnaire   Ask client: Do you give verbal consent for me to treat you today? 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    Visit Setting with Client Home    Patient Status Migrant    Insurance Uninsured ([place] Card/Care Connects/Self-Pay/Medicaid Family Planning)    Insurance/Financial Assistance Referral N/A    Medication N/A    Medical Provider Yes    Screening Referrals Made N/A    Medical Referrals Made N/A    Medical Appointment Made N/A    Recently w/o PCP, now 1st time PCP visit completed due to CNs referral or appointment made N/A    Food N/A    Transportation N/A    Housing/Utilities N/A    Interpersonal Safety N/A    Interventions Energy manager;Advocate/Support    Abnormal to Normal Screening Since Last CN Visit N/A    Screenings CN Performed N/A    Sent Client to Lab for: N/A    Did client attend any of the following based off CNs referral or appointments made? N/A    ED Visit Averted Yes    Life-Saving Intervention Made N/A            This CN met with client in her home by request.  Client has not been feeling well.  She reports a cough and congestion for two weeks.  She is tired.  She has vomited a couple times in the last week.  She does say that she has upcoming MD appointments.  We did a covid home test with Negative results today. Client would like this CN to check her finger stick glucose.  Client plans to come to [facility] next Tuesday for the glucose check.  Encouraged client to drink fluids, encouraged broth soups for comfort, and encouraged client to rest when tired.  This CN will follow up  with client as requested.   Karene Fry, RN, MSN, [place] Office [phone] Cell

## 2022-06-03 ENCOUNTER — Encounter: Payer: Self-pay | Admitting: *Deleted

## 2022-06-03 DIAGNOSIS — R7309 Other abnormal glucose: Secondary | ICD-10-CM

## 2022-06-03 LAB — GLUCOSE, POCT (MANUAL RESULT ENTRY): POC Glucose: 100 mg/dl — AB (ref 70–99)

## 2022-06-04 ENCOUNTER — Other Ambulatory Visit: Payer: Self-pay

## 2022-06-04 NOTE — Congregational Nurse Program (Signed)
  Dept: [phone]   Congregational Nurse Program Note  Date of Encounter: 06/03/2022  Past Medical History: Past Medical History:  Diagnosis Date   Anemia    Asthma    childhood   Depression    Frequent UTI    Hyperlipidemia     Encounter Details:  CNP Questionnaire - 06/03/22 1630       Questionnaire   Ask client: Do you give verbal consent for me to treat you today? Yes    Student Assistance N/A    Location Patient [place]    Visit Setting with Client [place]    Patient Status Migrant    Insurance Uninsured ([place] Card/Care Connects/Self-Pay/Medicaid Family Planning)    Insurance/Financial Assistance Referral N/A    Medication N/A    Medical Provider Yes    Screening Referrals Made N/A    Medical Referrals Made N/A    Medical Appointment Made N/A    Recently w/o PCP, now 1st time PCP visit completed due to CNs referral or appointment made N/A    Food N/A    Transportation N/A    Housing/Utilities N/A    Interpersonal Safety N/A    Interventions Energy manager;Advocate/Support    Abnormal to Normal Screening Since Last CN Visit N/A    Screenings CN Performed Blood Glucose    Sent Client to Lab for: N/A    Did client attend any of the following based off CNs referral or appointments made? N/A    ED Visit Averted Yes    Life-Saving Intervention Made N/A            Client [facility].  Fingerstick glucose test performed by this CN, results 100 mg/dl.  Client had least eaten about 12:30 pm today.  Educated client regarding results and explained benefit of eating fewer starchy carbohydrates.  Client is feeling much better with less coughing and less congestion today.  Client will follow up with this CN as desired.  Karene Fry, RN, MSN, [place] Office [phone] Cell

## 2022-06-06 ENCOUNTER — Ambulatory Visit: Payer: Self-pay | Admitting: Clinical

## 2022-06-06 DIAGNOSIS — Z1331 Encounter for screening for depression: Secondary | ICD-10-CM

## 2022-06-06 NOTE — BH Specialist Note (Signed)
Integrated Behavioral Health Initial In-Person Visit  MRN: [id number] Name: Emily Andrade  Number of [place] Clinician visits: 1- Initial Visit  Session Start time: 8546    Session End time: 2703  Total time in minutes: 42   I reviewed patient visit with the [facility] Intern, and I concur with the treatment plan, as documented in the [facility] Intern note.   No charge for this visit due to [facility] Intern seeing patient.  Vesta Mixer, MSW, [place] for [facility] at [facility]  Types of Service: Individual psychotherapy  Interpretor:Yes.   Interpretor Name and Language: Lavonna Monarch #[id number]  Subjective: Emily Andrade is a 42 y.o. female Patient was referred by Arlina Robes, MD for positive PHQ9. Patient reports the following symptoms/concerns: depressive symptoms  Duration of problem: ongoing ; Severity of problem: moderate  Objective: Mood: Depressed and Hopeless and Affect: Tearful Risk of harm to self or others: No plan to harm self or others  Life Context: Family and Social: Long-term partner, 2 children, one boy, one girl School/Work: Currently working as a Veterinary surgeon part time  Self-Care: Spending time with friends and weekly family dinners with older brother  Life Changes: Struggles w/ her sons socialization   Patient and/or Family's Strengths/Protective Factors: Social connections, Social and Emotional competence, Sense of purpose, and Parental Resilience  Goals Addressed: Patient will: Reduce symptoms of: depression and stress Increase knowledge and/or ability of: coping skills, healthy habits, self-management skills, and stress reduction  Demonstrate ability to: Increase healthy adjustment to current life circumstances and Increase adequate support systems for patient/family  Progress towards Goals: Ongoing  Interventions: Interventions utilized:  Supportive Counseling and Supportive Reflection  Standardized Assessments completed: Not Needed  Patient and/or Family Response: Pt agrees with treatment plan   Assessment: Patient currently experiencing stresses from challenges with her sons socialization and managing her own mental health .   Patient may benefit from brief therapeutic interventions and medication management .  Plan: Follow up with behavioral health clinician on : 2/16 at 10:45 AM  Behavioral recommendations: -Continue prioritizing healthy self-care (regular meals, adequate rest; allowing practical help from supportive friends and family)  Referral(s): [place] (In Clinic) and [employer] Resources:  Food  [employer]

## 2022-06-10 ENCOUNTER — Other Ambulatory Visit: Payer: Self-pay

## 2022-06-10 MED ORDER — PROAIR RESPICLICK 108 (90 BASE) MCG/ACT IN AEPB
INHALATION_SPRAY | RESPIRATORY_TRACT | 1 refills | Status: DC
  Filled 2022-06-10: qty 1, 30d supply, fill #0

## 2022-06-10 MED ORDER — PULMICORT FLEXHALER 90 MCG/ACT IN AEPB
2.0000 | INHALATION_SPRAY | Freq: Two times a day (BID) | RESPIRATORY_TRACT | 2 refills | Status: DC
  Filled 2022-06-10 (×2): qty 1, 30d supply, fill #0

## 2022-06-11 ENCOUNTER — Other Ambulatory Visit: Payer: Self-pay

## 2022-06-11 MED ORDER — ALBUTEROL SULFATE HFA 108 (90 BASE) MCG/ACT IN AERS
2.0000 | INHALATION_SPRAY | RESPIRATORY_TRACT | 2 refills | Status: DC | PRN
  Filled 2022-06-11: qty 6.7, 17d supply, fill #0

## 2022-06-18 ENCOUNTER — Other Ambulatory Visit: Payer: Self-pay

## 2022-06-20 ENCOUNTER — Ambulatory Visit: Payer: Self-pay | Admitting: Clinical

## 2022-06-20 DIAGNOSIS — Z91199 Patient's noncompliance with other medical treatment and regimen due to unspecified reason: Secondary | ICD-10-CM

## 2022-06-20 NOTE — BH Specialist Note (Signed)
Pt did not arrive to video visit and did not answer the phone; Left HIPPA-compliant message to call back [place] from [employer] for [employer] at [facility] at  [phone] [facility] office).

## 2022-06-21 IMAGING — MG DIGITAL DIAGNOSTIC BILAT W/ TOMO W/ CAD
8 series · 8 of 24 positions shown · non-contrast
Comparison: None.
COMPARISON: None.

Addendum:
CLINICAL DATA: Patient presents for right breast pain. Patient
states that the pain has nearly resolved over the last few weeks,
now with only mild diffuse pain while lifting her arm.

EXAM:
DIGITAL DIAGNOSTIC BILATERAL MAMMOGRAM WITH TOMOSYNTHESIS AND CAD
TECHNIQUE: Bilateral digital diagnostic mammography and breast tomosynthesis
was performed. The images were evaluated with computer-aided
detection.

[L CC synth-2D]
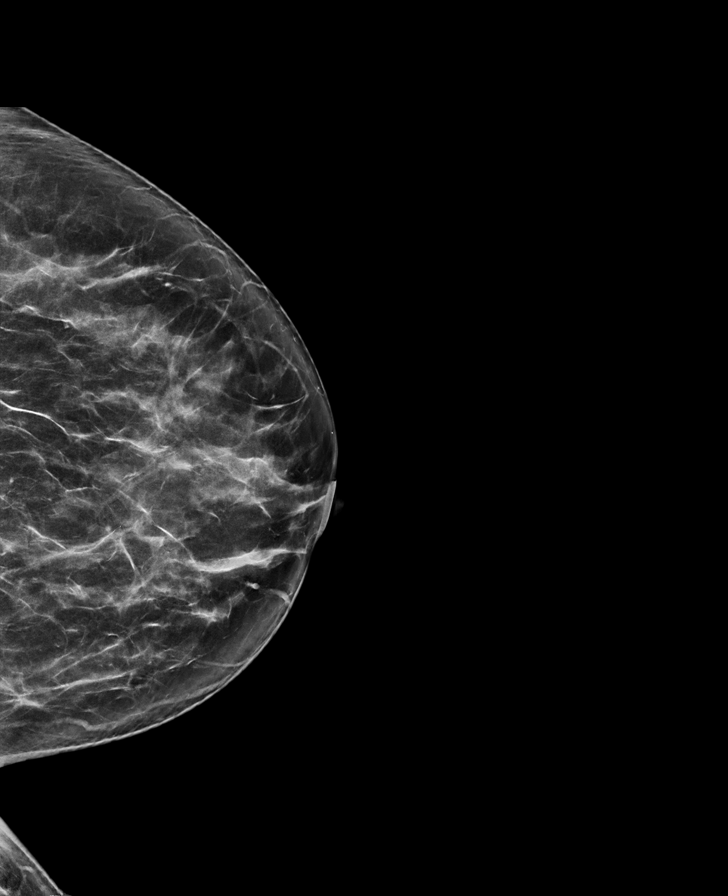

[R CC synth-2D]
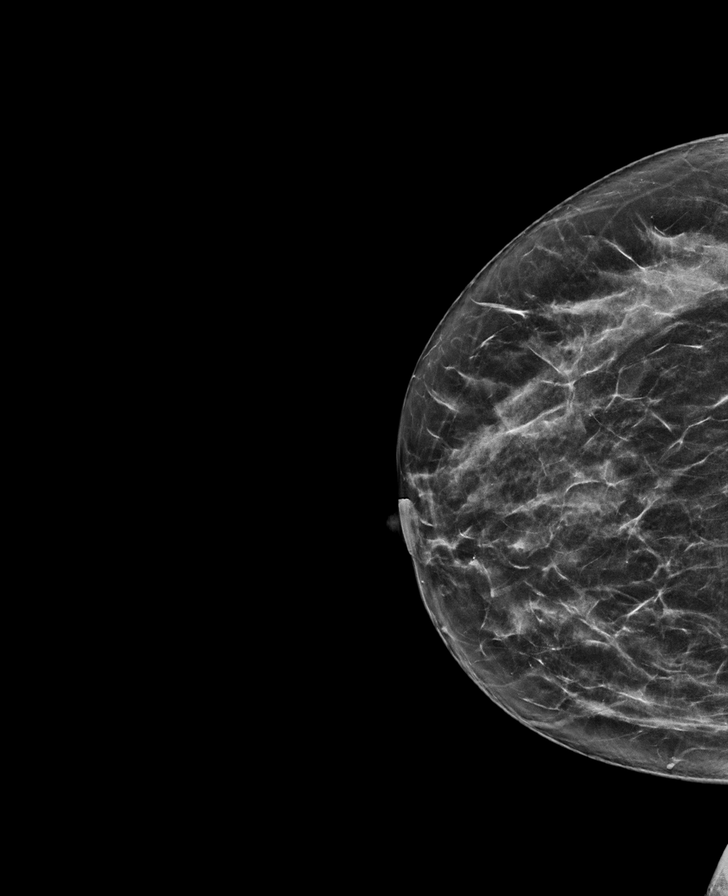

[L MLO synth-2D]
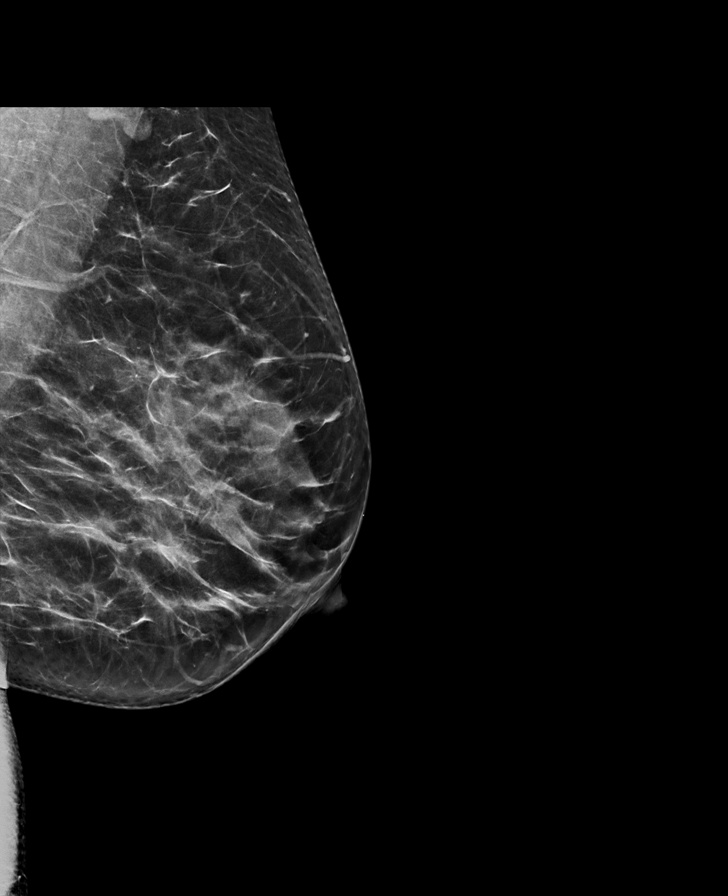

[R MLO synth-2D]
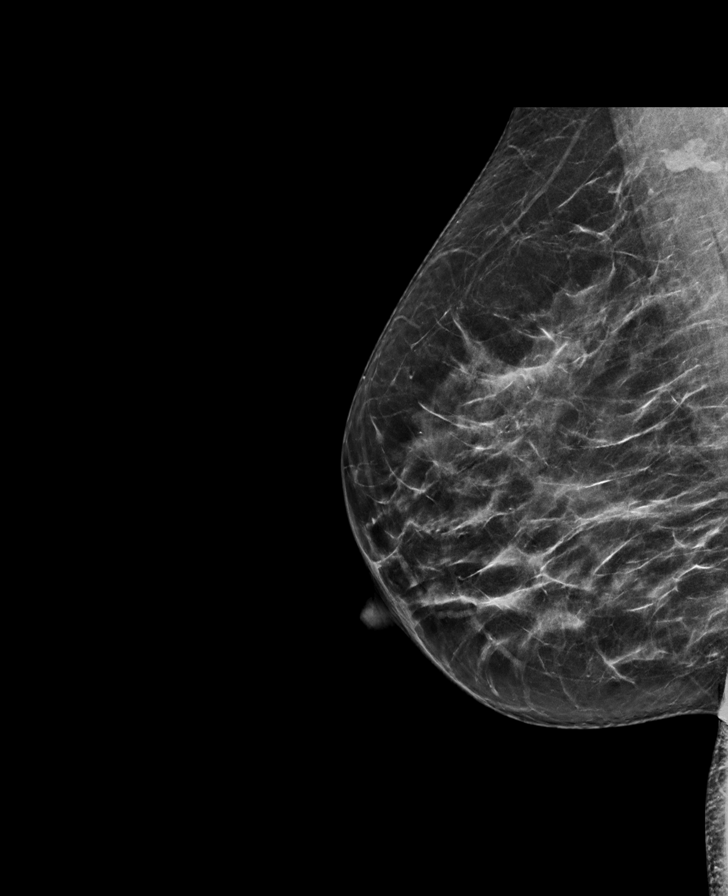

[R CC tomo · tomo slice 31/61.0]
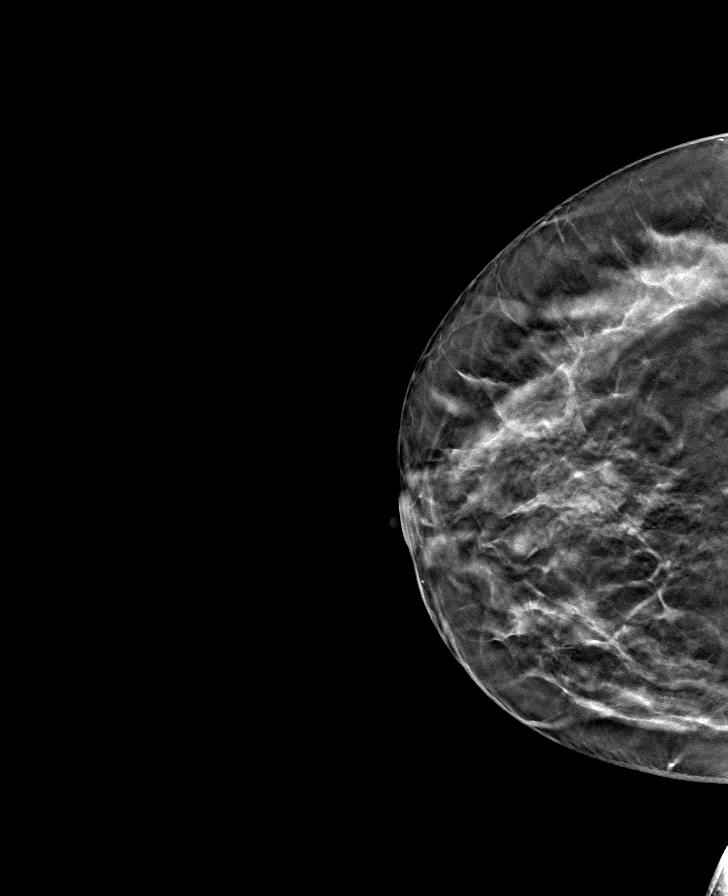

[R MLO tomo · tomo slice 33/66.0]
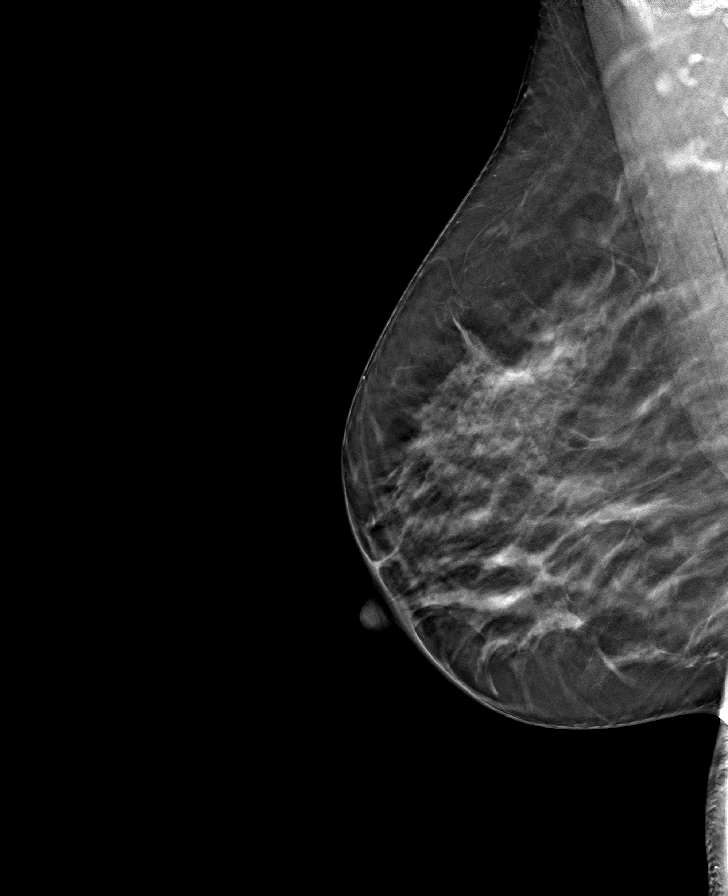

[L CC tomo · tomo slice 37/72.0]
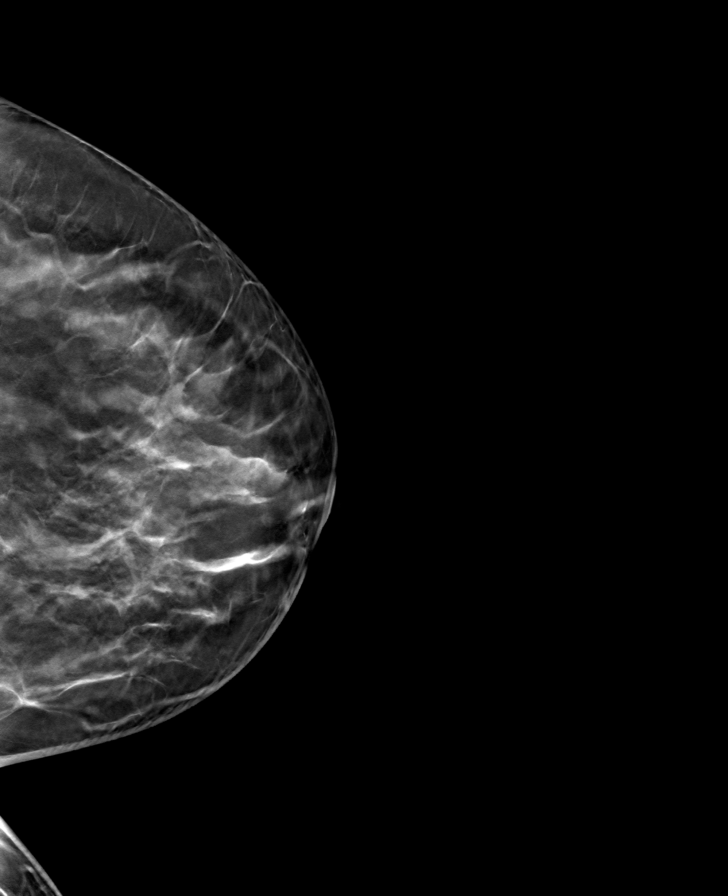

[L MLO tomo · tomo slice 38/75.0]
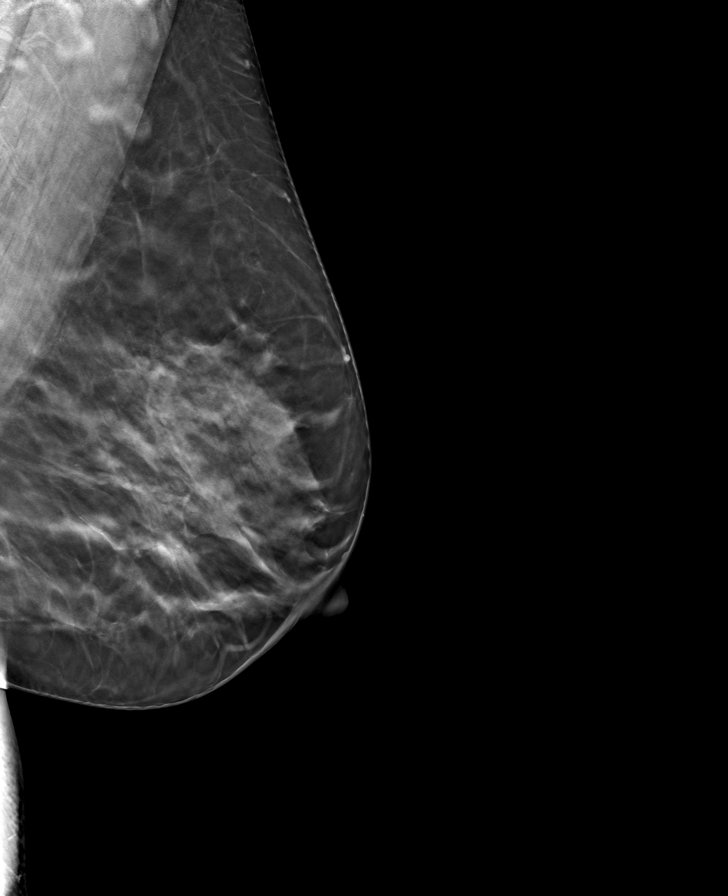

[8 of 24 positions shown; findings below may reference images not displayed]

ACR Breast Density Category c: The breast tissue is heterogeneously
dense, which may obscure small masses.
FINDINGS: No concerning masses, calcifications or distortion identified within
either breast.
IMPRESSION: No mammographic evidence for malignancy.

RECOMMENDATION:
Continued clinical evaluation for right breast tenderness.

No mammographic evidence for malignancy.

I have discussed the findings and recommendations with the patient.
If applicable, a reminder letter will be sent to the patient
regarding the next appointment.

BI-RADS CATEGORY  1: Negative.

ADDENDUM:
Addition for recommendation:

Annual screening mammography.

*** End of Addendum ***
ACR Breast Density Category c: The breast tissue is heterogeneously
dense, which may obscure small masses.
FINDINGS: No concerning masses, calcifications or distortion identified within
either breast.
IMPRESSION: No mammographic evidence for malignancy.

RECOMMENDATION:
Continued clinical evaluation for right breast tenderness.

No mammographic evidence for malignancy.

I have discussed the findings and recommendations with the patient.
If applicable, a reminder letter will be sent to the patient
regarding the next appointment.

BI-RADS CATEGORY  1: Negative.

## 2022-06-24 ENCOUNTER — Ambulatory Visit: Payer: No Typology Code available for payment source | Admitting: Obstetrics and Gynecology

## 2022-06-25 ENCOUNTER — Ambulatory Visit: Payer: Self-pay | Attending: Physician Assistant | Admitting: Physician Assistant

## 2022-06-25 ENCOUNTER — Other Ambulatory Visit: Payer: Self-pay

## 2022-06-25 ENCOUNTER — Encounter: Payer: Self-pay | Admitting: Physician Assistant

## 2022-06-25 VITALS — BP 118/82 | HR 91 | Wt 161.2 lb

## 2022-06-25 DIAGNOSIS — J9801 Acute bronchospasm: Secondary | ICD-10-CM

## 2022-06-25 DIAGNOSIS — Z09 Encounter for follow-up examination after completed treatment for conditions other than malignant neoplasm: Secondary | ICD-10-CM

## 2022-06-25 MED ORDER — PREDNISONE 10 MG PO TABS
ORAL_TABLET | ORAL | 0 refills | Status: AC
  Filled 2022-06-25: qty 21, 6d supply, fill #0

## 2022-06-25 MED ORDER — PROMETHAZINE-DM 6.25-15 MG/5ML PO SYRP
5.0000 mL | ORAL_SOLUTION | Freq: Four times a day (QID) | ORAL | 0 refills | Status: DC | PRN
  Filled 2022-06-25: qty 118, 6d supply, fill #0

## 2022-06-25 NOTE — Patient Instructions (Signed)
Broncoespasmo en los adultos Bronchospasm, Adult  El broncoespasmo es un estrechamiento de las pequeas vas respiratorias en los pulmones. Esto puede dificultar mucho la respiracin. La hinchazn y ms mucosidad de lo normal pueden sumarse a este problema. Cules son las causas? Tener un resfro. Actividad fsica. El [employer] proviene de [place], perfumes, velas y limpiadores. Aire fro. El estrs y los sentimientos fuertes, como llorar o rer. Qu incrementa el riesgo? Tener asma. Fumar. Estar cerca de alguien que fuma (humo ambiental de tabaco). Tener alergias. Ser alrgico a ciertos alimentos, medicamentos o picaduras de insectos. Cules son los signos o sntomas? Emitir un sonido de silbido agudo al respirar, ms a menudo al exhalar (sibilancias). Tos. Sensacin de opresin en el pecho. Sensacin de que no puede respirar normalmente. Sensacin de que no tiene energa para Engineer, site. Respiracin ruidosa. Tos aguda. Cmo se trata?  Medicamentos que se aspiran (inhalan). Estos abren las vas respiratorias y Presenter, broadcasting. Los medicamentos pueden administrarse con un inhalador con medidor de dosis o Furniture conservator/restorer. Medicamentos para reducir Information systems manager. Eliminar lo que ha iniciado el broncoespasmo. Siga estas instrucciones en su casa: Medicamentos Use los medicamentos de venta libre y los recetados solamente como se lo haya indicado el mdico. Si necesita un inhalador o un nebulizador para tomar su medicamento, pregntele a su mdico cmo usarlo. Es posible que le den un espaciador para usar con Forensic psychologist. Esto facilita el ingreso del medicamento del inhalador en los pulmones. Estilo de vida No fume ni consuma ningn producto que contenga nicotina o tabaco. Si necesita ayuda para dejar de fumar, consulte al mdico. Lleve un registro de los factores que desencadenan el broncoespasmo. Evtelos si puede. Cuando los niveles de [place], contaminacin del aire o  humedad sean altos, Quarry manager las ventanas cerradas. Si tiene, use un aire acondicionado. Busque formas de [employer] y los sentimientos. Actividad Algunas personas sufren broncoespasmo cuando realizan actividad fsica. Esto se denomina broncoconstriccin inducida por el ejercicio (BCIE). Si tiene [employer], hable con el mdico sobre cmo lidiar con la BCIE. Algunos consejos incluyen: Use el inhalador antes de hacer ejercicio. Haga ejercicio en el interior si el clima est muy fro o hmedo, o si el polen y el moho estn elevados. Precaliente antes de hacer ejercicio y haga movimientos de enfriamiento despus. Deje de hacer ejercicio de inmediato si comienza a tener sntomas o estos empeoran. Instrucciones generales Si tiene asma, debe disponer de un plan de accin para el asma. [place] (inmunizaciones). Concurra a [place]. Solicite ayuda de inmediato si: Tiene dificultad para respirar. Tiene sibilancias y tos, y esto no mejora despus de Geophysical data processor. Siente dolor en el pecho. Tiene dificultad para hablar ms de una palabra en una oracin. Estos sntomas pueden Sales executive. Solicite ayuda de inmediato. Llame al 911. No espere a ver si los sntomas desaparecen. No conduzca por sus propios medios Principal Financial. Resumen El broncoespasmo es un estrechamiento de las pequeas vas respiratorias en los pulmones. La hinchazn y ms mucosidad de lo normal pueden sumarse a este problema. Esto puede dificultar mucho la respiracin. No fume ni consuma ningn producto que contenga nicotina o tabaco. Si necesita ayuda para dejar de fumar, consulte al mdico. Obtenga ayuda de inmediato si tiene sibilancias y tos, y esto no mejora despus de tomar medicamentos. Esta informacin no tiene Marine scientist el consejo del mdico. Asegrese de hacerle al mdico cualquier pregunta que tenga. Document  Revised: 12/19/2020 Document  Reviewed: 12/19/2020 Elsevier Patient Education  [place].

## 2022-06-25 NOTE — Progress Notes (Signed)
Patient ID: Emily Andrade, female   DOB: Feb 20, 1981, 42 y.o.   MRN: [id number]   Emily Andrade, is a 42 y.o. female  [id number]  [id number]  DOB - 04/16/1981  Chief Complaint  Patient presents with   Medication Refill   Asthma       Subjective:   Emily Andrade is a 42 y.o. female here today for a follow up after being seeing 06/10/2022 and put on antibiotics and inhalers.  She is feeling better overall.  No fever.  She is still wheezing and coughing at night  No problems updated.  ALLERGIES: Allergies  Allergen Reactions   Statins Other (See Comments)    Abdominal pain    PAST MEDICAL HISTORY: Past Medical History:  Diagnosis Date   Anemia    Asthma    childhood   Depression    Frequent UTI    Hyperlipidemia     MEDICATIONS AT HOME: Prior to Admission medications   Medication Sig Start Date End Date Taking? Authorizing Provider  albuterol (PROAIR HFA) 108 (90 Base) MCG/ACT inhaler Inhale 2 puffs into the lungs every 6 (six) hours as needed for wheezing or shortness of breath. 05/28/22  Yes Elsie Stain, MD  albuterol (VENTOLIN HFA) 108 (90 Base) MCG/ACT inhaler Inhale 2 puffs into the lungs every 4 (four) hours as needed for shortness of breath or wheezing. 06/11/22  Yes   Budesonide (PULMICORT FLEXHALER) 90 MCG/ACT inhaler Inhale 2 Puffs into the lungs 2 (two) times daily 06/10/22  Yes   cetirizine (ZYRTEC) 10 MG tablet Take 1 Tablet by mouth once daily 05/08/22  Yes   fluticasone (FLONASE) 50 MCG/ACT nasal spray Place 1 spray into both nostrils daily. 05/08/22  Yes   Multiple Vitamin (MULTIVITAMIN ADULT PO) Take by mouth.   Yes [provider]  Norethindrone-Ethinyl Estradiol-Fe Biphas (LO LOESTRIN FE) 1 MG-10 MCG / 10 MCG tablet Take 1 tablet by mouth daily. 05/29/22  Yes Chancy Milroy, MD  predniSONE (DELTASONE) 10 MG tablet Take 6 tablets (60 mg total) by mouth daily for 1 day, THEN 5 tablets (50 mg total) daily for 1 day,  THEN 4 tablets (40 mg total) daily for 1 day, THEN 3 tablets (30 mg total) daily for 1 day, THEN 2 tablets (20 mg total) daily for 1 day, THEN 1 tablet (10 mg total) daily for 1 day. 06/25/22 07/01/22 Yes Argentina Donovan, PA-C  promethazine-dextromethorphan (PROMETHAZINE-DM) 6.25-15 MG/5ML syrup Take 5 mLs by mouth 4 (four) times daily as needed for cough. 06/25/22  Yes Nader Boys, Dionne Bucy, PA-C    ROS: Neg cardiac Neg GI Neg GU Neg MS Neg psych Neg neuro  Objective:   Vitals:   06/25/22 1603  BP: 118/82  Pulse: 91  SpO2: 97%  Weight: 161 lb 3.2 oz (73.1 kg)   Exam General appearance : Awake, alert, not in any distress. Speech Clear. Not toxic looking HEENT: Atraumatic and Normocephalic, pupils equally reactive to light and accomodation Neck: Supple, no JVD. No cervical lymphadenopathy.  Chest: Good air entry bilaterally, CTAB.  No rales/rhonchi.  Mild wheezing B bases with forced expiration CVS: S1 S2 regular, no murmurs.  Extremities: B/L Lower Ext shows no edema, both legs are warm to touch Neurology: Awake alert, and oriented X 3, CN II-XII intact, Non focal Skin: No Rash  Data Review Lab Results  Component Value Date   HGBA1C 5.4 09/24/2017    Assessment & Plan   1. Bronchospasm post URI -  promethazine-dextromethorphan (PROMETHAZINE-DM) 6.25-15 MG/5ML syrup; Take 5 mLs by mouth 4 (four) times daily as needed for cough.  Dispense: 118 mL; Refill: 0 - predniSONE (DELTASONE) 10 MG tablet; Take 6 tablets (60 mg total) by mouth daily for 1 day, THEN 5 tablets (50 mg total) daily for 1 day, THEN 4 tablets (40 mg total) daily for 1 day, THEN 3 tablets (30 mg total) daily for 1 day, THEN 2 tablets (20 mg total) daily for 1 day, THEN 1 tablet (10 mg total) daily for 1 day.  Dispense: 21 tablet; Refill: 0  2. Encounter for examination following treatment at hospital     Return if symptoms worsen or fail to improve.  The patient was given clear instructions to go to ER or  return to medical center if symptoms don't improve, worsen or new problems develop. The patient verbalized understanding. The patient was told to call to get lab results if they haven't heard anything in the next week.      Freeman Caldron, PA-C [facility] and [place], [place]   06/25/2022, 5:10 PM

## 2022-06-26 ENCOUNTER — Other Ambulatory Visit: Payer: Self-pay

## 2022-07-18 ENCOUNTER — Ambulatory Visit: Payer: Self-pay

## 2022-07-18 DIAGNOSIS — Z23 Encounter for immunization: Secondary | ICD-10-CM

## 2022-07-30 ENCOUNTER — Other Ambulatory Visit: Payer: Self-pay

## 2022-07-30 MED ORDER — FLUTICASONE PROPIONATE 50 MCG/ACT NA SUSP
1.0000 | Freq: Every day | NASAL | 1 refills | Status: DC
  Filled 2022-07-30: qty 16, 60d supply, fill #0

## 2022-07-30 MED ORDER — BACLOFEN 10 MG PO TABS
10.0000 mg | ORAL_TABLET | Freq: Three times a day (TID) | ORAL | 0 refills | Status: DC
  Filled 2022-07-30: qty 21, 7d supply, fill #0

## 2022-07-31 ENCOUNTER — Other Ambulatory Visit: Payer: Self-pay

## 2022-09-22 NOTE — Progress Notes (Unsigned)
Complete physical exam  Patient: Emily Andrade   DOB: 1980/09/03   42 y.o. Female  MRN: [id number]  Subjective:    No chief complaint on file.   Emily Andrade is a 42 y.o. female who presents today for a complete physical exam. She reports consuming a {diet types:17450} diet. {types:19826} She generally feels {DESC; WELL/FAIRLY WELL/POORLY:18703}. She reports sleeping {DESC; WELL/FAIRLY WELL/POORLY:18703}. She {does/does not:200015} have additional problems to discuss today.  Not seen since 07/2021   Most recent fall risk assessment:    06/25/2022    4:05 PM  Fall Risk   Falls in the past year? 0  Number falls in past yr: 0  Injury with Fall? 0  Risk for fall due to : No Fall Risks     Most recent depression screenings:    06/25/2022    4:05 PM 04/14/2022    9:18 AM  PHQ 2/9 Scores  PHQ - 2 Score 2 4  PHQ- 9 Score 8 13    {VISON DENTAL STD PSA (Optional):27386}  {History (Optional):23778}  Patient Care Team: Storm Frisk, MD as PCP - General (Pulmonary Disease)   Outpatient Medications Prior to Visit  Medication Sig   albuterol (PROAIR HFA) 108 (90 Base) MCG/ACT inhaler Inhale 2 puffs into the lungs every 6 (six) hours as needed for wheezing or shortness of breath.   albuterol (VENTOLIN HFA) 108 (90 Base) MCG/ACT inhaler Inhale 2 puffs into the lungs every 4 (four) hours as needed for shortness of breath or wheezing.   baclofen (LIORESAL) 10 MG tablet Take 1 tablet (10 mg total) by mouth 3 (three) times daily for 7 days.   Budesonide (PULMICORT FLEXHALER) 90 MCG/ACT inhaler Inhale 2 Puffs into the lungs 2 (two) times daily   cetirizine (ZYRTEC) 10 MG tablet Take 1 Tablet by mouth once daily   fluticasone (FLONASE) 50 MCG/ACT nasal spray Place 1 spray into both nostrils daily.   fluticasone (FLONASE) 50 MCG/ACT nasal spray Place 1 spray into both nostrils daily.   Multiple Vitamin (MULTIVITAMIN ADULT PO) Take by mouth.    Norethindrone-Ethinyl Estradiol-Fe Biphas (LO LOESTRIN FE) 1 MG-10 MCG / 10 MCG tablet Take 1 tablet by mouth daily.   promethazine-dextromethorphan (PROMETHAZINE-DM) 6.25-15 MG/5ML syrup Take 5 mLs by mouth 4 (four) times daily as needed for cough.   No facility-administered medications prior to visit.    ROS        Objective:     There were no vitals taken for this visit. {Vitals History (Optional):23777}  Physical Exam   No results found for any visits on 09/23/22. {Show previous labs (optional):23779}    Assessment & Plan:    Routine Health Maintenance and Physical Exam  Immunization History  Administered Date(s) Administered   Influenza,inj,Quad PF,6+ Mos 02/07/2020, 02/05/2021, 06/17/2022   Influenza,inj,quad, With Preservative 02/15/2020   Influenza-Unspecified 02/23/2018   MMR 04/10/2013   PFIZER(Purple Top)SARS-COV-2 Vaccination 09/13/2019, 10/25/2019, 06/05/2020, 06/09/2020   Tdap 01/25/2018, 02/23/2018    Health Maintenance  Topic Date Due   INFLUENZA VACCINE  12/04/2022   PAP SMEAR-Modifier  07/03/2024   DTaP/Tdap/Td (3 - Td or Tdap) 02/24/2028   Hepatitis C Screening  Completed   HIV Screening  Completed   HPV VACCINES  Aged Out   COVID-19 Vaccine  Discontinued    Discussed health benefits of physical activity, and encouraged her to engage in regular exercise appropriate for her age and condition.  Problem List Items Addressed This Visit   None  No follow-ups on file.     Shan Levans, MD

## 2022-09-23 ENCOUNTER — Ambulatory Visit: Payer: Self-pay | Attending: Critical Care Medicine | Admitting: Critical Care Medicine

## 2022-09-23 ENCOUNTER — Encounter: Payer: Self-pay | Admitting: Critical Care Medicine

## 2022-09-23 ENCOUNTER — Other Ambulatory Visit (HOSPITAL_COMMUNITY)
Admission: RE | Admit: 2022-09-23 | Discharge: 2022-09-23 | Disposition: A | Discharge status: Home / Self Care | Payer: Self-pay | Source: Ambulatory Visit | Attending: Critical Care Medicine | Admitting: Critical Care Medicine

## 2022-09-23 VITALS — BP 110/75 | HR 69 | Ht 60.25 in | Wt 159.8 lb

## 2022-09-23 DIAGNOSIS — K521 Toxic gastroenteritis and colitis: Secondary | ICD-10-CM

## 2022-09-23 DIAGNOSIS — N949 Unspecified condition associated with female genital organs and menstrual cycle: Secondary | ICD-10-CM

## 2022-09-23 DIAGNOSIS — R102 Pelvic and perineal pain: Secondary | ICD-10-CM

## 2022-09-23 DIAGNOSIS — F3342 Major depressive disorder, recurrent, in full remission: Secondary | ICD-10-CM

## 2022-09-23 DIAGNOSIS — Z113 Encounter for screening for infections with a predominantly sexual mode of transmission: Secondary | ICD-10-CM | POA: Insufficient documentation

## 2022-09-23 DIAGNOSIS — J4520 Mild intermittent asthma, uncomplicated: Secondary | ICD-10-CM

## 2022-09-23 DIAGNOSIS — N926 Irregular menstruation, unspecified: Secondary | ICD-10-CM

## 2022-09-23 DIAGNOSIS — T3695XA Adverse effect of unspecified systemic antibiotic, initial encounter: Secondary | ICD-10-CM

## 2022-09-23 DIAGNOSIS — R319 Hematuria, unspecified: Secondary | ICD-10-CM | POA: Insufficient documentation

## 2022-09-23 MED ORDER — ALBUTEROL SULFATE HFA 108 (90 BASE) MCG/ACT IN AERS: 2.0000 | INHALATION_SPRAY | Freq: Four times a day (QID) | RESPIRATORY_TRACT | 0 refills | Status: DC | PRN

## 2022-09-23 MED ORDER — METRONIDAZOLE 500 MG PO TABS: 500.0000 mg | ORAL_TABLET | Freq: Three times a day (TID) | ORAL | 0 refills | Status: AC

## 2022-09-23 MED ORDER — FLUTICASONE PROPIONATE 50 MCG/ACT NA SUSP: 1.0000 | Freq: Every day | NASAL | 1 refills | Status: DC

## 2022-09-23 MED ORDER — BACLOFEN 10 MG PO TABS: 10.0000 mg | ORAL_TABLET | Freq: Three times a day (TID) | ORAL | 0 refills | Status: DC

## 2022-09-23 MED ORDER — CETIRIZINE HCL 10 MG PO TABS: 10.0000 mg | ORAL_TABLET | Freq: Every day | ORAL | 3 refills | Status: DC

## 2022-09-23 MED ORDER — PULMICORT FLEXHALER 90 MCG/ACT IN AEPB: 2.0000 | INHALATION_SPRAY | Freq: Two times a day (BID) | RESPIRATORY_TRACT | 2 refills | Status: DC

## 2022-09-23 NOTE — Assessment & Plan Note (Signed)
Referral back to gynecology she needs imaging she had not wish to have this before

## 2022-09-23 NOTE — Assessment & Plan Note (Signed)
Asthma stable at this time continue inhalers

## 2022-09-23 NOTE — Assessment & Plan Note (Signed)
Referral back to gynecology 

## 2022-09-23 NOTE — Assessment & Plan Note (Signed)
Check urinalysis. 

## 2022-09-23 NOTE — Patient Instructions (Addendum)
All medications refilled, start flagyl one three times a day for diarrhea A cervical vaginal swab was obtained Referral back to gynecology was made Return Dr Delford Field 3 months  [employer] se renuevan, comience con flagyl uno tres veces al da para la diarrea. Se obtuvo un hisopo vaginal cervical. Se realiz remisin a ginecologa. Volver Dr Delford Field 3 meses

## 2022-09-23 NOTE — Assessment & Plan Note (Signed)
Short trial of Flagyl

## 2022-09-23 NOTE — Assessment & Plan Note (Signed)
Recheck for STD screen with cervical vaginal swab

## 2022-09-23 NOTE — Assessment & Plan Note (Signed)
Appears to been made worse by oral birth control meds refer back to gynecology

## 2022-09-25 LAB — CERVICOVAGINAL ANCILLARY ONLY
Bacterial Vaginitis (gardnerella): NEGATIVE
Candida Glabrata: NEGATIVE
Candida Vaginitis: NEGATIVE
Chlamydia: NEGATIVE
Comment: NEGATIVE
Comment: NEGATIVE
Comment: NEGATIVE
Comment: NEGATIVE
Comment: NEGATIVE
Comment: NORMAL
Neisseria Gonorrhea: NEGATIVE
Trichomonas: NEGATIVE

## 2022-09-25 LAB — URINALYSIS
Bilirubin, UA: NEGATIVE
Glucose, UA: NEGATIVE
Ketones, UA: NEGATIVE
Leukocytes,UA: NEGATIVE
Nitrite, UA: NEGATIVE
Protein,UA: NEGATIVE
RBC, UA: NEGATIVE
Specific Gravity, UA: 1.023 (ref 1.005–1.030)
Urobilinogen, Ur: 0.2 mg/dL (ref 0.2–1.0)
pH, UA: 5.5 (ref 5.0–7.5)

## 2022-09-25 NOTE — Progress Notes (Signed)
Let pt know vaginal exam no STD or infection

## 2022-09-26 ENCOUNTER — Telehealth: Payer: Self-pay

## 2022-09-26 NOTE — Telephone Encounter (Signed)
-----   Message from Storm Frisk, MD sent at 09/25/2022  4:17 PM EDT ----- Let pt know vaginal exam no STD or infection

## 2022-09-26 NOTE — Telephone Encounter (Signed)
Pt was called and is aware of results, DOB was confirmed.  Interpreter id [phone]

## 2022-11-04 ENCOUNTER — Telehealth: Payer: Self-pay

## 2022-11-04 NOTE — Telephone Encounter (Signed)
Pt has left several messages. Using [employer], [place], [place], [place]: [phone], I have called the pt back. She advised she is calling to schedule her mammogram. I have advised the pt she is supposed to call the number on the front of her pink card but I will send a message to the scheduling team for her with this request. Pt satisfied with this plan.

## 2022-11-13 ENCOUNTER — Telehealth: Payer: Self-pay

## 2022-11-13 NOTE — Telephone Encounter (Signed)
Telephoned patient at mobile number. Mailed patient a mammogram scholarship application.

## 2022-12-22 ENCOUNTER — Other Ambulatory Visit: Payer: Self-pay | Admitting: Obstetrics and Gynecology

## 2022-12-22 ENCOUNTER — Encounter: Payer: Self-pay | Admitting: Obstetrics and Gynecology

## 2022-12-22 DIAGNOSIS — Z1231 Encounter for screening mammogram for malignant neoplasm of breast: Secondary | ICD-10-CM

## 2022-12-30 ENCOUNTER — Other Ambulatory Visit: Payer: Self-pay

## 2022-12-30 ENCOUNTER — Other Ambulatory Visit: Payer: Self-pay | Admitting: Pharmacist

## 2022-12-30 ENCOUNTER — Encounter: Payer: Self-pay | Admitting: Critical Care Medicine

## 2022-12-30 ENCOUNTER — Ambulatory Visit: Payer: Self-pay | Attending: Critical Care Medicine | Admitting: Critical Care Medicine

## 2022-12-30 VITALS — BP 116/84 | HR 65

## 2022-12-30 DIAGNOSIS — L309 Dermatitis, unspecified: Secondary | ICD-10-CM

## 2022-12-30 DIAGNOSIS — J4520 Mild intermittent asthma, uncomplicated: Secondary | ICD-10-CM

## 2022-12-30 DIAGNOSIS — K521 Toxic gastroenteritis and colitis: Secondary | ICD-10-CM

## 2022-12-30 DIAGNOSIS — Z23 Encounter for immunization: Secondary | ICD-10-CM

## 2022-12-30 DIAGNOSIS — T3695XA Adverse effect of unspecified systemic antibiotic, initial encounter: Secondary | ICD-10-CM

## 2022-12-30 DIAGNOSIS — N949 Unspecified condition associated with female genital organs and menstrual cycle: Secondary | ICD-10-CM

## 2022-12-30 DIAGNOSIS — E782 Mixed hyperlipidemia: Secondary | ICD-10-CM

## 2022-12-30 MED ORDER — CETIRIZINE HCL 10 MG PO TABS
10.0000 mg | ORAL_TABLET | Freq: Every day | ORAL | 3 refills | Status: AC
  Filled 2022-12-30: qty 90, 90d supply, fill #0

## 2022-12-30 MED ORDER — SENNA-DOCUSATE SODIUM 8.6-50 MG PO TABS
2.0000 | ORAL_TABLET | Freq: Every day | ORAL | 2 refills | Status: AC
  Filled 2022-12-30: qty 100, 50d supply, fill #0

## 2022-12-30 MED ORDER — ALBUTEROL SULFATE HFA 108 (90 BASE) MCG/ACT IN AERS
2.0000 | INHALATION_SPRAY | Freq: Four times a day (QID) | RESPIRATORY_TRACT | 0 refills | Status: DC | PRN
  Filled 2022-12-30: qty 6.7, 25d supply, fill #0

## 2022-12-30 MED ORDER — FLUTICASONE PROPIONATE 50 MCG/ACT NA SUSP
1.0000 | Freq: Every day | NASAL | 1 refills | Status: AC
  Filled 2022-12-30: qty 16, 60d supply, fill #0

## 2022-12-30 MED ORDER — ARNUITY ELLIPTA 100 MCG/ACT IN AEPB
1.0000 | INHALATION_SPRAY | Freq: Every day | RESPIRATORY_TRACT | 2 refills | Status: DC
  Filled 2022-12-30: qty 30, fill #0

## 2022-12-30 MED ORDER — CLOBETASOL PROPIONATE 0.05 % EX CREA
1.0000 | TOPICAL_CREAM | Freq: Two times a day (BID) | CUTANEOUS | 0 refills | Status: AC
  Filled 2022-12-30: qty 30, 30d supply, fill #0

## 2022-12-30 MED ORDER — PULMICORT FLEXHALER 90 MCG/ACT IN AEPB
2.0000 | INHALATION_SPRAY | Freq: Two times a day (BID) | RESPIRATORY_TRACT | 2 refills | Status: DC
Start: 2022-12-30 — End: 2022-12-30
  Filled 2022-12-30: qty 1, 30d supply, fill #0

## 2022-12-30 NOTE — Assessment & Plan Note (Signed)
Check labs 

## 2022-12-30 NOTE — Assessment & Plan Note (Signed)
Continue inhaled medication

## 2022-12-30 NOTE — Assessment & Plan Note (Signed)
After seeing gynecology referral sent yet again

## 2022-12-30 NOTE — Patient Instructions (Addendum)
Flu vaccine was given Medications refilled Labs today Referral to stomach doctor made REturn our clinic 4 months Follow diet attached and take medication for constipation  Se puso la vacuna contra la gripe Medicamentos reabastecidos Laboratorios hoy Se realiz una derivacin a un mdico estomacal. Devolver nuestra clnica 4 meses Siga la dieta adjunta y tome medicamentos para el estreimiento.

## 2022-12-30 NOTE — Progress Notes (Signed)
Patient: Emily Andrade   DOB: 05-09-1980   42 y.o. Female  MRN: [id number]  Subjective:    Chief Complaint  Patient presents with   Constipation   09/23/22 Visit today was assisted by video Spanish interpreter Vikki Ports [id number] Anett Veerkamp Nafeesa Struve is a 42 y.o. female who presents today for a complete physical exam. She reports consuming a general diet. The patient does not participate in regular exercise at present. She generally feels fairly well. She reports sleeping well. She does have additional problems to discuss today.  Not seen since 07/2021 Patient is here today for a physical exam but does wish to complain that she had a history of chlamydial infection seen a couple months ago at [facility] she had excess bleeding and chlamydia and was given a course of Augmentin.  She was also on oral birth control pills to treat dysmenorrhea and as well adnexal lesion in the right lower pelvis.  She was to follow-up with gynecology with this appointment has not occurred.  After taking the antibiotic she developed diarrhea with blood in her stools.  She also had some hematuria. She is here for complete physical she does have right lower quadrant pain and upper quadrant pain without rebound or guarding   12/30/22 The patient is seen in follow-up and the visit assisted with video interpreter Laurine Blazer [phone].  Patient has a history of antibiotic associated colitis she was slowly resolving then had increased constipation with past 3 weeks with slight amount of blood and tissue.  She denies any abdominal pain.  She does have asthma for which she has been on treatment of steroids in the past.  She is now on as needed inhaled medications at this time.  She does have a mammogram scheduled finally on her breast cancer control program October 3 of this year.  There are no other complaints.  She has a gynecology referral but is yet to be scheduled. 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   Screen for STD (sexually transmitted disease) 09/23/2022   Adnexal cyst 04/14/2022   Allergy to statin medication 07/22/2021   Irregular menses 04/22/2021   Pelvic pain 04/22/2021   Hyperlipidemia 09/18/2020   Language barrier 11/17/2017   Depression    Asthma    Past Medical History:  Diagnosis Date   Anemia    Asthma    childhood   Depression    Frequent UTI    Hyperlipidemia    Past Surgical History:  Procedure Laterality Date   CESAREAN SECTION N/A 04/08/2013   Procedure: CESAREAN SECTION;  Surgeon: Reva Bores, MD;  Location: [facility];  Service: Obstetrics;  Laterality: N/A;   CESAREAN SECTION N/A 04/13/2018   Procedure: CESAREAN SECTION;  Surgeon: Levie Heritage, DO;  Location: [facility];  Service: Obstetrics;  Laterality: N/A;   Social History   Tobacco Use   Smoking status: Never   Smokeless tobacco: Never  Vaping Use   Vaping status: Never Used  Substance Use Topics   Alcohol use: No  Alcohol/week: 0.0 standard drinks of alcohol   Drug use: No   Social History   Socioeconomic History   Marital status: Single    Spouse name: Not on file   Number of children: 2   Years of education: Not on file   Highest education level: 6th grade  Occupational History   Not on file  Tobacco Use   Smoking status: Never   Smokeless tobacco: Never  Vaping Use   Vaping status: Never Used  Substance and Sexual Activity   Alcohol use: No    Alcohol/week: 0.0 standard drinks of alcohol   Drug use: No   Sexual activity: Yes    Birth control/protection: Condom  Other Topics Concern   Not on file  Social  History Narrative   Not on file   Social Determinants of Health   Financial Resource Strain: Not on File (12/31/2021)   Received from [employer] [employer]    Financial Resource Strain: 0  Food Insecurity: Food Insecurity Present (04/14/2022)   Hunger Vital Sign    Worried About Running Out of Food in the Last Year: Sometimes true    Ran Out of Food in the Last Year: Sometimes true  Transportation Needs: No Transportation Needs (04/14/2022)   PRAPARE - Administrator, Civil Service (Medical): No    Lack of Transportation (Non-Medical): No  Physical Activity: Not on File (12/31/2021)   Received from [place], [place]   Physical Activity    Physical Activity: 0  Stress: Not on File (12/31/2021)   Received from [facility], [place]   Stress    Stress: 0  Social Connections: Not on File (12/31/2021)   Received from [place], [place]   Social Connections    Social Connections and Isolation: 0  Intimate Partner Violence: Not on file   Family Status  Relation Name Status   Mother  Alive   Father  Deceased   Sister  Alive   Other neice Deceased   Neg Hx  (Not Specified)  No partnership data on file   Family History  Problem Relation Age of Onset   Hypertension Mother    Heart disease Father        MI   Diabetes Father    Diabetes Sister    Birth defects Other        Downs syndrome, cleft lip and palate   Breast cancer Neg Hx    Allergies  Allergen Reactions   Statins Other (See Comments)    Abdominal pain      Patient Care Team: Storm Frisk, MD as PCP - General (Pulmonary Disease)   Outpatient Medications Prior to Visit  Medication Sig   baclofen (LIORESAL) 10 MG tablet Take 1 tablet (10 mg total) by mouth 3 (three) times daily for 7 days.   Multiple Vitamin (MULTIVITAMIN ADULT PO) Take by mouth.   [DISCONTINUED] albuterol (PROAIR HFA) 108 (90 Base) MCG/ACT inhaler Inhale 2 puffs into the lungs every 6 (six) hours as needed for wheezing or  shortness of breath.   [DISCONTINUED] Budesonide (PULMICORT FLEXHALER) 90 MCG/ACT inhaler Inhale 2 Puffs into the lungs 2 (two) times daily   [DISCONTINUED] cetirizine (ZYRTEC) 10 MG tablet Take 1 Tablet by mouth once daily   [DISCONTINUED] fluticasone (FLONASE) 50 MCG/ACT nasal spray Place 1 spray into both nostrils daily.   No facility-administered medications prior to visit.    Review of Systems  Constitutional:  Negative for chills, diaphoresis, fever, malaise/fatigue and weight loss.  HENT:  Negative for congestion, hearing loss, nosebleeds, sore throat and tinnitus.   Eyes:  Negative for blurred vision, photophobia and redness.  Respiratory:  Negative for cough, hemoptysis, sputum production, shortness of breath, wheezing and stridor.   Cardiovascular:  Negative for chest pain, palpitations, orthopnea, claudication, leg swelling and PND.  Gastrointestinal:  Positive for blood in stool and constipation. Negative for abdominal pain, diarrhea, heartburn, melena, nausea and vomiting.  Genitourinary:  Negative for dysuria, flank pain, frequency, hematuria and urgency.  Musculoskeletal:  Negative for back pain, falls, joint pain, myalgias and neck pain.  Skin:  Negative for itching and rash.  Neurological:  Negative for dizziness, tingling, tremors, sensory change, speech change, focal weakness, seizures, loss of consciousness, weakness and headaches.  Endo/Heme/Allergies:  Negative for environmental allergies and polydipsia. Does not bruise/bleed easily.  Psychiatric/Behavioral:  Negative for depression, memory loss, substance abuse and suicidal ideas. The patient is not nervous/anxious and does not have insomnia.           Objective:     BP 116/84 (BP Location: Left Arm, Patient Position: Sitting, Cuff Size: Normal)   Pulse 65   SpO2 97%  BP Readings from Last 3 Encounters:  12/30/22 116/84  09/23/22 110/75  06/25/22 118/82   Wt Readings from Last 3 Encounters:  09/23/22 159  lb 12.8 oz (72.5 kg)  06/25/22 161 lb 3.2 oz (73.1 kg)  04/14/22 161 lb 12.8 oz (73.4 kg)      Physical Exam Vitals reviewed. Exam conducted with a chaperone present.  Constitutional:      Appearance: Normal appearance. She is well-developed. She is not diaphoretic.  HENT:     Head: Normocephalic and atraumatic.     Nose: No nasal deformity, septal deviation, mucosal edema or rhinorrhea.     Right Sinus: No maxillary sinus tenderness or frontal sinus tenderness.     Left Sinus: No maxillary sinus tenderness or frontal sinus tenderness.     Mouth/Throat:     Pharynx: No oropharyngeal exudate.  Eyes:     General: No scleral icterus.    Conjunctiva/sclera: Conjunctivae normal.     Pupils: Pupils are equal, round, and reactive to light.  Neck:     Thyroid: No thyromegaly.     Vascular: No carotid bruit or JVD.     Trachea: Trachea normal. No tracheal tenderness or tracheal deviation.  Cardiovascular:     Rate and Rhythm: Normal rate and regular rhythm.     Chest Wall: PMI is not displaced.     Pulses: Normal pulses. No decreased pulses.     Heart sounds: Normal heart sounds, S1 normal and S2 normal. Heart sounds not distant. No murmur heard.    No systolic murmur is present.     No diastolic murmur is present.     No friction rub. No gallop. No S3 or S4 sounds.  Pulmonary:     Effort: No tachypnea, accessory muscle usage or respiratory distress.     Breath sounds: No stridor. No decreased breath sounds, wheezing, rhonchi or rales.  Chest:     Chest wall: No tenderness.  Breasts:    Right: Normal.     Left: Normal.  Abdominal:     General: Bowel sounds are normal. There is no distension.     Palpations: Abdomen is soft. Abdomen is not rigid.     Tenderness: There is abdominal tenderness. There is no guarding or rebound.     Comments: Right upper and lower quadrant tenderness  Musculoskeletal:        General: Normal range of motion.     Cervical back: Normal range of motion  and neck supple. No edema, erythema or rigidity. No muscular tenderness. Normal range of motion.  Lymphadenopathy:     Head:     Right side of head: No submental or submandibular adenopathy.     Left side of head: No submental or submandibular adenopathy.     Cervical: No cervical adenopathy.  Skin:    General: Skin is warm and dry.     Coloration: Skin is not pale.     Findings: Rash present.     Nails: There is no clubbing.     Comments: Rash of both lower legs  Neurological:     Mental Status: She is alert and oriented to person, place, and time.     Sensory: No sensory deficit.  Psychiatric:        Speech: Speech normal.        Behavior: Behavior normal.      No results found for any visits on 12/30/22.     Assessment & Plan:    Routine Health Maintenance and Physical Exam  Immunization History  Administered Date(s) Administered   Hepatitis A, Adult 05/10/2019   Hepatitis B, ADULT 05/10/2019   Influenza Split 09/09/2012, 02/03/2013, 02/23/2018   Influenza, Seasonal, Injecte, Preservative Fre 12/30/2022   Influenza,inj,Quad PF,6+ Mos 02/07/2020, 02/05/2021, 06/17/2022   Influenza,inj,quad, With Preservative 02/15/2020   Influenza-Unspecified 02/23/2018   MMR 04/10/2013   PFIZER(Purple Top)SARS-COV-2 Vaccination 09/13/2019, 09/29/2019, 10/25/2019, 06/05/2020, 06/09/2020   Tdap 01/06/2013, 01/25/2018, 02/23/2018    Health Maintenance  Topic Date Due   PAP SMEAR-Modifier  07/03/2024   DTaP/Tdap/Td (4 - Td or Tdap) 02/24/2028   INFLUENZA VACCINE  Completed   Hepatitis C Screening  Completed   HIV Screening  Completed   HPV VACCINES  Aged Out   COVID-19 Vaccine  Discontinued    Discussed health benefits of physical activity, and encouraged her to engage in regular exercise appropriate for her age and condition.  Problem List Items Addressed This Visit       Respiratory   Asthma    Continue inhaled medication      Relevant Medications   albuterol (PROAIR  HFA) 108 (90 Base) MCG/ACT inhaler     Digestive   Antibiotic-induced colitis - Primary    Referral to gastroenterology      Relevant Orders   Ambulatory referral to Gastroenterology   CBC with Differential/Platelet   Comprehensive metabolic panel     Musculoskeletal and Integument   Eczema    Trial of topical steroid and moisturizer        Genitourinary   Adnexal cyst    After seeing gynecology referral sent yet again        Other   Hyperlipidemia    Check labs      Other Visit Diagnoses     Need for immunization against influenza       Relevant Orders   Flu vaccine trivalent PF, 6mos and older(Flulaval,Afluria,Fluarix,Fluzone) (Completed)      35 minutes spent extra time needed assessing multiple problems Return in about 4 months (around 05/01/2023) for primary care follow up.     Shan Levans, MD

## 2022-12-30 NOTE — Assessment & Plan Note (Signed)
Referral to gastroenterology

## 2022-12-30 NOTE — Assessment & Plan Note (Signed)
Trial of topical steroid and moisturizer

## 2022-12-31 LAB — CBC WITH DIFFERENTIAL/PLATELET
Basophils Absolute: 0 10*3/uL (ref 0.0–0.2)
Basos: 1 %
EOS (ABSOLUTE): 0.2 10*3/uL (ref 0.0–0.4)
Eos: 4 %
Hematocrit: 36.4 % (ref 34.0–46.6)
Hemoglobin: 11.8 g/dL (ref 11.1–15.9)
Immature Grans (Abs): 0 10*3/uL (ref 0.0–0.1)
Immature Granulocytes: 0 %
Lymphocytes Absolute: 2.7 10*3/uL (ref 0.7–3.1)
Lymphs: 43 %
MCH: 26.3 pg — ABNORMAL LOW (ref 26.6–33.0)
MCHC: 32.4 g/dL (ref 31.5–35.7)
MCV: 81 fL (ref 79–97)
Monocytes Absolute: 0.5 10*3/uL (ref 0.1–0.9)
Monocytes: 7 %
Neutrophils Absolute: 2.8 10*3/uL (ref 1.4–7.0)
Neutrophils: 45 %
Platelets: 243 10*3/uL (ref 150–450)
RBC: 4.49 x10E6/uL (ref 3.77–5.28)
RDW: 12.4 % (ref 11.7–15.4)
WBC: 6.2 10*3/uL (ref 3.4–10.8)

## 2022-12-31 LAB — COMPREHENSIVE METABOLIC PANEL
ALT: 20 IU/L (ref 0–32)
AST: 20 IU/L (ref 0–40)
Albumin: 4.1 g/dL (ref 3.9–4.9)
Alkaline Phosphatase: 66 IU/L (ref 44–121)
BUN/Creatinine Ratio: 17 (ref 9–23)
BUN: 12 mg/dL (ref 6–24)
Bilirubin Total: <0.2 mg/dL (ref 0.0–1.2)
CO2: 23 mmol/L (ref 20–29)
Calcium: 9.2 mg/dL (ref 8.7–10.2)
Chloride: 104 mmol/L (ref 96–106)
Creatinine, Ser: 0.71 mg/dL (ref 0.57–1.00)
Globulin, Total: 2.6 g/dL (ref 1.5–4.5)
Glucose: 99 mg/dL (ref 70–99)
Potassium: 4.3 mmol/L (ref 3.5–5.2)
Sodium: 139 mmol/L (ref 134–144)
Total Protein: 6.7 g/dL (ref 6.0–8.5)
eGFR: 107 mL/min/{1.73_m2} (ref >59)

## 2022-12-31 NOTE — Progress Notes (Signed)
Let pt know all labs normal  no anemia

## 2023-01-01 ENCOUNTER — Telehealth: Payer: Self-pay

## 2023-01-01 NOTE — Telephone Encounter (Signed)
-----   Message from Shan Levans sent at 12/31/2022  6:14 AM EDT ----- Let pt know all labs normal  no anemia

## 2023-01-01 NOTE — Telephone Encounter (Signed)
Pt was called and is aware of results, DOB was confirmed    Interpreter: Jose  Number: [phone]

## 2023-01-02 ENCOUNTER — Other Ambulatory Visit: Payer: Self-pay

## 2023-01-12 ENCOUNTER — Other Ambulatory Visit: Payer: Self-pay

## 2023-01-16 ENCOUNTER — Other Ambulatory Visit: Payer: Self-pay

## 2023-01-19 ENCOUNTER — Other Ambulatory Visit: Payer: Self-pay

## 2023-01-19 ENCOUNTER — Telehealth: Payer: Self-pay

## 2023-01-19 NOTE — Telephone Encounter (Signed)
Received notification from [facility] regarding approval for ARNUITY. Patient assistance approved from 01/15/2023 to 01/15/2024.  Phone: [phone]  PATIENT RECORD # WITH [employer]: [id number]  MEDICATION WILL SHIP TO PATIENT'S HOME ADDRESS. APPLICATION UPLOADED TO MEDIA

## 2023-02-05 ENCOUNTER — Ambulatory Visit: Payer: Self-pay

## 2023-02-06 ENCOUNTER — Other Ambulatory Visit: Payer: Self-pay

## 2023-02-26 ENCOUNTER — Other Ambulatory Visit: Payer: Self-pay

## 2023-02-26 MED ORDER — BACLOFEN 10 MG PO TABS
10.0000 mg | ORAL_TABLET | Freq: Three times a day (TID) | ORAL | 0 refills | Status: AC
  Filled 2023-02-26: qty 21, 7d supply, fill #0

## 2023-03-10 ENCOUNTER — Other Ambulatory Visit: Payer: Self-pay

## 2023-03-26 ENCOUNTER — Ambulatory Visit
Admission: RE | Admit: 2023-03-26 | Discharge: 2023-03-26 | Disposition: A | Discharge status: Home / Self Care | Payer: No Typology Code available for payment source | Source: Ambulatory Visit | Attending: Obstetrics and Gynecology | Admitting: Obstetrics and Gynecology

## 2023-03-26 ENCOUNTER — Ambulatory Visit: Payer: Self-pay | Admitting: Hematology and Oncology

## 2023-03-26 VITALS — BP 134/90 | Wt 160.0 lb

## 2023-03-26 DIAGNOSIS — Z1231 Encounter for screening mammogram for malignant neoplasm of breast: Secondary | ICD-10-CM

## 2023-03-26 NOTE — Progress Notes (Signed)
Emily Andrade is a 42 y.o. female who presents to [facility] clinic today with no complaints.    Pap Smear: Pap not smear completed today. Last Pap smear was 07/03/2021 and was normal. Per patient has no history of an abnormal Pap smear. Last Pap smear result is available in Epic.   Physical exam: Breasts Breasts symmetrical. No skin abnormalities bilateral breasts. No nipple retraction bilateral breasts. No nipple discharge bilateral breasts. No lymphadenopathy. No lumps palpated bilateral breasts.  MS DIGITAL SCREENING TOMO BILATERAL  Result Date: 01/07/2022 CLINICAL DATA:  Screening. EXAM: DIGITAL SCREENING BILATERAL MAMMOGRAM WITH TOMOSYNTHESIS AND CAD TECHNIQUE: Bilateral screening digital craniocaudal and mediolateral oblique mammograms were obtained. Bilateral screening digital breast tomosynthesis was performed. The images were evaluated with computer-aided detection. COMPARISON:  Previous exam(s). ACR Breast Density Category b: There are scattered areas of fibroglandular density. FINDINGS: There are no findings suspicious for malignancy. IMPRESSION: No mammographic evidence of malignancy. A result letter of this screening mammogram will be mailed directly to the patient. RECOMMENDATION: Screening mammogram in one year. (Code:SM-B-01Y) BI-RADS CATEGORY  1: Negative. Electronically Signed   By: Gerome Sam III M.D.   On: 01/07/2022 18:57   MS DIGITAL DIAG TOMO BILAT  Addendum Date: 12/25/2020   ADDENDUM REPORT: 12/25/2020 14:27 ADDENDUM: Addition for recommendation: Annual screening mammography. Electronically Signed   By: Annia Belt M.D.   On: 12/25/2020 14:27   Result Date: 12/25/2020 CLINICAL DATA:  Patient presents for right breast pain. Patient states that the pain has nearly resolved over the last few weeks, now with only mild diffuse pain while lifting her arm. EXAM: DIGITAL DIAGNOSTIC BILATERAL MAMMOGRAM WITH TOMOSYNTHESIS AND CAD TECHNIQUE: Bilateral digital diagnostic  mammography and breast tomosynthesis was performed. The images were evaluated with computer-aided detection. COMPARISON:  None. ACR Breast Density Category c: The breast tissue is heterogeneously dense, which may obscure small masses. FINDINGS: No concerning masses, calcifications or distortion identified within either breast. IMPRESSION: No mammographic evidence for malignancy. RECOMMENDATION: Continued clinical evaluation for right breast tenderness. No mammographic evidence for malignancy. I have discussed the findings and recommendations with the patient. If applicable, a reminder letter will be sent to the patient regarding the next appointment. BI-RADS CATEGORY  1: Negative. Electronically Signed: By: Annia Belt M.D. On: 12/25/2020 11:07        Pelvic/Bimanual Pap is not indicated today    Smoking History: Patient has never smoked and was not referred to quit line.    Patient Navigation: Patient education provided. Access to services provided for patient through B[facility]CP program. [name] interpreter provided. No transportation provided   Colorectal Cancer Screening: Per patient has never had colonoscopy completed No complaints today. FIT given.    Breast and Cervical Cancer Risk Assessment: Patient has family history of breast cancer, with maternal great grandmother and maternal second cousin. Patient does not have history of cervical dysplasia, immunocompromised, or DES exposure in-utero.  Risk Scores as of Encounter on 03/26/2023     Dondra Spry           5-year 0.96%   Lifetime 12.03%   This patient is [place]/Lat[place] but has no documented birth country, so the [place] model used data from [employer] patients to calculate their risk score. Document a birth country in the Demographics activity for a more accurate score.         Last calculated by Caprice Red, CMA on 03/26/2023 at 10:18 AM        A: B[facility]CP exam without  pap smear No complaints with benign exam.  P: Referred  patient to the [facility] for a screening mammogram. Appointment scheduled 03/26/2023.  Ilda Basset A, NP 03/26/2023 11:05 AM

## 2023-03-26 NOTE — Patient Instructions (Signed)
Taught Emily Andrade Emily Andrade about self breast awareness and gave educational materials to take home. Patient did not need a Pap smear today due to last Pap smear was in 07/03/2021 per patient.  Let her know B[facility]CP will cover Pap smears every 5 years unless has a history of abnormal Pap smears. Referred patient to the [facility] for screening mammogram. Appointment scheduled for 03/26/2023. Patient aware of appointment and will be there. Let patient know will follow up with her within the next couple weeks with results. Emily Andrade verbalized understanding.  Pascal Lux, NP 11:19 AM

## 2023-04-27 ENCOUNTER — Other Ambulatory Visit: Payer: Self-pay

## 2023-05-05 ENCOUNTER — Other Ambulatory Visit: Payer: Self-pay

## 2023-05-05 ENCOUNTER — Encounter: Payer: Self-pay | Admitting: Family Medicine

## 2023-05-05 ENCOUNTER — Ambulatory Visit: Payer: Self-pay | Attending: Family Medicine | Admitting: Family Medicine

## 2023-05-05 VITALS — BP 115/74 | HR 77 | Ht 60.0 in | Wt 162.8 lb

## 2023-05-05 DIAGNOSIS — G8929 Other chronic pain: Secondary | ICD-10-CM

## 2023-05-05 DIAGNOSIS — Z563 Stressful work schedule: Secondary | ICD-10-CM

## 2023-05-05 DIAGNOSIS — M546 Pain in thoracic spine: Secondary | ICD-10-CM

## 2023-05-05 DIAGNOSIS — M722 Plantar fascial fibromatosis: Secondary | ICD-10-CM

## 2023-05-05 DIAGNOSIS — R5383 Other fatigue: Secondary | ICD-10-CM

## 2023-05-05 MED ORDER — TIZANIDINE HCL 4 MG PO TABS
4.0000 mg | ORAL_TABLET | Freq: Three times a day (TID) | ORAL | 1 refills | Status: DC | PRN
  Filled 2023-05-05 (×2): qty 60, 20d supply, fill #0

## 2023-05-05 MED ORDER — DICLOFENAC SODIUM 1 % EX GEL
4.0000 g | Freq: Four times a day (QID) | CUTANEOUS | 1 refills | Status: AC
  Filled 2023-05-05: qty 100, 7d supply, fill #0

## 2023-05-05 NOTE — Progress Notes (Signed)
 Subjective:  Patient ID: Emily Andrade, female    DOB: 04-Jan-1981  Age: 42 y.o. MRN: [id number]  CC: Medical Management of Chronic Issues (Fatigue/Back pain/Pain in bottom of feet)   HPI Emily Andrade Emily Andrade is a 42 y.o. year old female patient of Dr Brien with a history of Asthma, Eczema.  Interval History: Discussed the use of AI scribe software for clinical note transcription with the patient, who gave verbal consent to proceed.  She presents with fatigue, back pain, and foot pain. She reports feeling fatigued for the past two months, sleeping only six to seven hours a night. The fatigue is present even when she is well-rested.  The patient has been experiencing foot pain for the past three months, describing it as a pressure-like sensation on the sole of her foot, particularly around the heel. The pain is present throughout the day and affects both feet.  The patient also reports severe upper back pain, which she has experienced since childhood, particularly during asthma flares. However, the back pain has returned in the past two months, despite the absence of asthma symptoms. The pain is severe enough to cause discomfort and necessitate frequent changes in position. The patient also reports numbness and tingling in her arms.  The patient also mentions work-related stress due to reduced working hours, which may be contributing to her symptoms.        Past Medical History:  Diagnosis Date   Anemia    Asthma    childhood   Depression    Frequent UTI    Hyperlipidemia     Past Surgical History:  Procedure Laterality Date   CESAREAN SECTION N/A 04/08/2013   Procedure: CESAREAN SECTION;  Surgeon: Glenys GORMAN Birk, MD;  Location: [facility];  Service: Obstetrics;  Laterality: N/A;   CESAREAN SECTION N/A 04/13/2018   Procedure: CESAREAN SECTION;  Surgeon: Barbra Lang PARAS, DO;  Location: [facility];  Service: Obstetrics;  Laterality: N/A;    Family  History  Problem Relation Age of Onset   Hypertension Mother    Heart disease Father        MI   Diabetes Father    Diabetes Sister    Birth defects Other        Downs syndrome, cleft lip and palate   Breast cancer Other     Social History   Socioeconomic History   Marital status: Single    Spouse name: Not on file   Number of children: 2   Years of education: Not on file   Highest education level: 6th grade  Occupational History   Not on file  Tobacco Use   Smoking status: Never   Smokeless tobacco: Never  Vaping Use   Vaping status: Never Used  Substance and Sexual Activity   Alcohol use: No    Alcohol/week: 0.0 standard drinks of alcohol   Drug use: No   Sexual activity: Yes    Birth control/protection: Condom  Other Topics Concern   Not on file  Social History Narrative   Not on file   Social Drivers of Health   Financial Resource Strain: Not on File (12/31/2021)   Received from [employer] [employer]    Financial Resource Strain: 0  Food Insecurity: Food Insecurity Present (03/26/2023)   Hunger Vital Sign    Worried About Running Out of Food in the Last Year: Sometimes true    Ran Out of Food in the Last Year: Sometimes  true  Transportation Needs: No Transportation Needs (03/26/2023)   PRAPARE - Administrator, Civil Service (Medical): No    Lack of Transportation (Non-Medical): No  Physical Activity: Not on File (12/31/2021)   Received from [place], [place]   Physical Activity    Physical Activity: 0  Stress: Not on File (12/31/2021)   Received from [facility], [place]   Stress    Stress: 0  Social Connections: Not on File (01/11/2023)   Received from [facility]   Social Connections    Connectedness: 0    Allergies  Allergen Reactions   Statins Other (See Comments)    Abdominal pain    Outpatient Medications Prior to Visit  Medication Sig Dispense Refill   albuterol  (PROAIR  HFA) 108 (90 Base) MCG/ACT inhaler Inhale 2 puffs into  the lungs every 6 (six) hours as needed for wheezing or shortness of breath. 6.7 g 0   cetirizine  (ZYRTEC ) 10 MG tablet Take 1 Tablet by mouth once daily (Patient not taking: Reported on 05/05/2023) 90 tablet 3   clobetasol  cream (TEMOVATE ) 0.05 % Apply 1 Application topically 2 (two) times daily. (Patient not taking: Reported on 05/05/2023) 30 g 0   fluticasone  (FLONASE ) 50 MCG/ACT nasal spray Place 1 spray into both nostrils daily. (Patient not taking: Reported on 05/05/2023) 16 g 1   Fluticasone  Furoate (ARNUITY ELLIPTA ) 100 MCG/ACT AEPB Inhale 1 Inhalation into the lungs daily. (Patient not taking: Reported on 05/05/2023) 30 each 2   Multiple Vitamin (MULTIVITAMIN ADULT PO) Take by mouth. (Patient not taking: Reported on 05/05/2023)     sennosides-docusate sodium  (SENOKOT-S) 8.6-50 MG tablet Take 2 tablets by mouth daily. (Patient not taking: Reported on 05/05/2023) 100 tablet 2   No facility-administered medications prior to visit.     ROS Review of Systems  Constitutional:  Positive for fatigue. Negative for activity change and appetite change.  HENT:  Negative for sinus pressure and sore throat.   Respiratory:  Negative for chest tightness, shortness of breath and wheezing.   Cardiovascular:  Negative for chest pain and palpitations.  Gastrointestinal:  Negative for abdominal distention, abdominal pain and constipation.  Genitourinary: Negative.   Musculoskeletal:  Positive for back pain.  Psychiatric/Behavioral:  Negative for behavioral problems and dysphoric mood.     Objective:  BP 115/74   Pulse 77   Ht 5' (1.524 m)   Wt 162 lb 12.8 oz (73.8 kg)   SpO2 99%   BMI 31.79 kg/m      05/05/2023    8:41 AM 03/26/2023   10:10 AM 12/30/2022    2:54 PM  BP/Weight  Systolic BP 115 134 116  Diastolic BP 74 90 84  Wt. (Lbs) 162.8 160   BMI 31.79 kg/m2 30.99 kg/m2       Physical Exam Constitutional:      Appearance: She is well-developed.  Cardiovascular:     Rate and  Rhythm: Normal rate.     Heart sounds: Normal heart sounds. No murmur heard. Pulmonary:     Effort: Pulmonary effort is normal.     Breath sounds: Normal breath sounds. No wheezing or rales.  Chest:     Chest wall: No tenderness.  Abdominal:     General: Bowel sounds are normal. There is no distension.     Palpations: Abdomen is soft. There is no mass.     Tenderness: There is no abdominal tenderness.  Musculoskeletal:        General: Normal range of motion.  Right lower leg: No edema.     Left lower leg: No edema.     Comments: Tenderness to palpation of bilateral heel and on dorsiflexion of bilateral feet  Neurological:     Mental Status: She is alert and oriented to person, place, and time.  Psychiatric:        Mood and Affect: Mood normal.        Latest Ref Rng & Units 12/30/2022    3:28 PM 04/22/2021   10:31 AM 04/14/2018    9:18 AM  CMP  Glucose 70 - 99 mg/dL 99  93  878   BUN 6 - 24 mg/dL 12  10  6    Creatinine 0.57 - 1.00 mg/dL 9.28  9.33  9.29   Sodium 134 - 144 mmol/L 139  139  130   Potassium 3.5 - 5.2 mmol/L 4.3  4.5  3.6   Chloride 96 - 106 mmol/L 104  101  102   CO2 20 - 29 mmol/L 23  24  19    Calcium  8.7 - 10.2 mg/dL 9.2  9.5  7.9   Total Protein 6.0 - 8.5 g/dL 6.7  7.5    Total Bilirubin 0.0 - 1.2 mg/dL <9.7  0.3    Alkaline Phos 44 - 121 IU/L 66  75    AST 0 - 40 IU/L 20  23    ALT 0 - 32 IU/L 20  18      Lipid Panel     Component Value Date/Time   CHOL 228 (H) 04/22/2021 1033   TRIG 131 04/22/2021 1033   HDL 53 04/22/2021 1033   CHOLHDL 4.3 04/22/2021 1033   LDLCALC 152 (H) 04/22/2021 1033    CBC    Component Value Date/Time   WBC 6.2 12/30/2022 1528   WBC 11.1 (H) 04/14/2018 0535   RBC 4.49 12/30/2022 1528   RBC 3.05 (L) 04/14/2018 0535   HGB 11.8 12/30/2022 1528   HCT 36.4 12/30/2022 1528   PLT 243 12/30/2022 1528   MCV 81 12/30/2022 1528   MCH 26.3 (L) 12/30/2022 1528   MCH 28.9 04/14/2018 0535   MCHC 32.4 12/30/2022 1528    MCHC 33.1 04/14/2018 0535   RDW 12.4 12/30/2022 1528   LYMPHSABS 2.7 12/30/2022 1528   EOSABS 0.2 12/30/2022 1528   BASOSABS 0.0 12/30/2022 1528    Lab Results  Component Value Date   HGBA1C 5.4 09/24/2017    No results found for: TSH  Assessment & Plan:      Fatigue Present for two months. Sleep duration is 6-7 hours. No anemia on recent blood tests. -Order thyroid function tests and Vitamin D  level to rule out common causes of fatigue.  Plantar Fasciitis Bilateral foot pain present for three months, described as pressure-like pain in the arch of the foot, present throughout the day. -Prescribe topical anti-inflammatory cream for application on the soles of the feet. -Advise over-the-counter insoles for additional heel cushioning. -If symptoms persist, consider referral to a podiatrist for possible corticosteroid injections. -Provide patient with stretching exercises for plantar fasciitis.  Upper Back Pain Severe, present since childhood, exacerbated by prolonged standing. Associated with numbness and tingling in the arms. -Refer to physical therapy for exercises and massage. -Extend tizanidine  -If symptoms persist, consider further evaluation.  Work-related Stress Patient reports feeling stressed due to reduced work hours. Elevated PHQ-9 -Patient declined medication for stress management. Encourage non-pharmacological stress management techniques.  General Health Maintenance -Order blood work excluding cholesterol due to recent  food intake.          Meds ordered this encounter  Medications   tiZANidine  (ZANAFLEX ) 4 MG tablet    Sig: Take 1 tablet (4 mg total) by mouth every 8 (eight) hours as needed.    Dispense:  60 tablet    Refill:  1   diclofenac  Sodium (VOLTAREN ) 1 % GEL    Sig: Apply 4 g topically 4 (four) times daily.    Dispense:  100 g    Refill:  1    Follow-up: Return in about 6 months (around 11/02/2023) for Chronic medical conditions.        Corrina Sabin, MD, FAAFP. [facility] [place], [place] [phone]   05/05/2023, 9:02 AM

## 2023-05-05 NOTE — Patient Instructions (Addendum)
Fascitis plantar, rehabilitacin Plantar Fasciitis Rehab Pregunte al mdico qu ejercicios son seguros para usted. Haga los ejercicios exactamente como se lo haya indicado el mdico y gradelos como se lo hayan indicado. Es normal sentir un estiramiento leve, tironeo, opresin o malestar al hacer estos ejercicios. Detngase de inmediato si siente un dolor repentino o el dolor empeora. No comience a hacer estos ejercicios hasta que se lo indique el mdico. Ejercicios de elongacin y amplitud de movimiento Estos ejercicios calientan los msculos y las articulaciones, y mejoran la movilidad y la flexibilidad del pie. Adems, ayudan a aliviar el dolor. Estiramiento de la fascia plantar  Sintese con la pierna izquierda/derecha cruzada sobre la rodilla opuesta. Sostenga el taln con una mano, con el pulgar cerca del arco. Con la otra mano, sostenga los dedos de los pies y empjelos con cuidado hacia arriba. Debe sentir un estiramiento en la base (la parte de abajo) de los dedos o en la parte de abajo del pie (fascia plantar), o en ambos. Mantenga esta posicin durante _________ segundos. Afloje lentamente los dedos y vuelva a la posicin inicial. Repita __________ veces. Realice este ejercicio __________ veces al da. Estiramiento de los gemelos, de pie Este ejercicio tambin se denomina estiramiento de la pantorrilla (los msculos gemelos). Estira los msculos posteriores de la parte superior de la pantorrilla. Prese con las manos apoyadas sobre la pared. Extienda la pierna izquierda/derecha hacia atrs y flexione ligeramente la rodilla de la pierna de adelante. Mantenga los talones apoyados en el suelo, los dedos apuntando hacia delante y la rodilla de atrs extendida, y lleve el peso hacia la pared. No arquee la espalda. Debe sentir un ligero estiramiento en la parte superior de la pantorrilla. Mantenga esta posicin durante __________ segundos. Repita __________ veces. Realice este ejercicio  __________ veces al da. Estiramiento del msculo sleo, de pie Este ejercicio tambin se denomina estiramiento de la pantorrilla (sleo). Estira los msculos posteriores de la parte inferior de la pantorrilla. Prese con las manos apoyadas sobre la pared. Extienda la pierna izquierda/derecha hacia atrs y flexione ligeramente la rodilla de la pierna de adelante. Mantenga los talones apoyados en el suelo y los dedos apuntando hacia delante, flexione la rodilla de atrs y lleve el peso ligeramente a la pierna de atrs. Debe sentir un estiramiento suave en la parte profunda de la parte inferior de la pantorrilla. Mantenga esta posicin durante __________ segundos. Repita __________ veces. Realice este ejercicio __________ veces al da. Estiramiento de los msculos gemelos y sleo, de pie con un escaln Este ejercicio estira los msculos posteriores de la parte inferior de la pierna. Estos msculos se encuentran en la parte superior de la pantorrilla (gastrocnemio) y la parte inferior de la pantorrilla (sleo). Prese delante de un escaln apoyando solo la regin metatarsiana de su pie derecho/izquierdo. La regin metatarsiana del pie es la superficie sobre la que caminamos, justo debajo de los dedos. Mantenga el otro pie apoyado con firmeza en el mismo escaln. Sostngase de la pared o de una baranda para mantener el equilibrio. Levante lentamente el otro pie, y permita que el peso del cuerpo presione el taln sobre el borde del frente del escaln. Mantenga la rodilla recta y sin doblar. Debe sentir un estiramiento en la pantorrilla. Mantenga esta posicin durante __________ segundos. Vuelva a poner ambos pies sobre el escaln. Repita este ejercicio con una leve flexin en la rodilla izquierda/derecha. Reptalo __________ veces con la rodilla izquierda/derecha extendida y __________ veces con la rodilla izquierda/derecha flexionada. Realice este ejercicio   __________ veces al da. Ejercicio de  equilibrio Este ejercicio aumenta el equilibrio y el control de la fuerza del arco, para ayudar a reducir la presin sobre la fascia plantar. Pararse sobre una pierna Si este ejercicio es muy fcil, puede intentar hacerlo con los ojos cerrados o parado sobre [place]. Sin calzado, prese cerca de una baranda o [place]. Puede sostenerse de la baranda o del marco de la puerta, segn lo necesite. Prese sobre el pie izquierdo/derecho. Sin despegar el dedo gordo del suelo, levante el arco del pie. Debe sentir un estiramiento en la parte de abajo del pie y el arco. No deje que el pie se vaya hacia adentro. Mantenga esta posicin durante __________ segundos. Repita __________ veces. Realice este ejercicio __________ veces al da. Esta informacin no tiene Theme park manager el consejo del mdico. Asegrese de hacerle al mdico cualquier pregunta que tenga. Document Revised: 03/02/2020 Document Reviewed: 03/02/2020 Elsevier Patient Education  2024 [employer].

## 2023-05-06 LAB — VITAMIN D 25 HYDROXY (VIT D DEFICIENCY, FRACTURES): Vit D, 25-Hydroxy: 21 ng/mL — ABNORMAL LOW (ref 30.0–100.0)

## 2023-05-06 LAB — T4, FREE: Free T4: 1.1 ng/dL (ref 0.82–1.77)

## 2023-05-06 LAB — TSH: TSH: 3.67 u[IU]/mL (ref 0.450–4.500)

## 2023-05-07 ENCOUNTER — Other Ambulatory Visit: Payer: Self-pay

## 2023-05-07 ENCOUNTER — Other Ambulatory Visit: Payer: Self-pay | Admitting: Family Medicine

## 2023-05-07 MED ORDER — ERGOCALCIFEROL 1.25 MG (50000 UT) PO CAPS
50000.0000 [IU] | ORAL_CAPSULE | ORAL | 1 refills | Status: AC
  Filled 2023-05-07: qty 12, 84d supply, fill #0

## 2023-05-07 MED ORDER — VITAMIN D3 10 MCG (400 UNIT) PO TABS: 800.0000 [IU] | ORAL_TABLET | Freq: Every day | ORAL | 1 refills | Status: AC

## 2023-05-08 ENCOUNTER — Other Ambulatory Visit: Payer: Self-pay

## 2023-05-22 ENCOUNTER — Telehealth: Payer: Self-pay | Admitting: Critical Care Medicine

## 2023-05-22 NOTE — Telephone Encounter (Signed)
Copied from CRM [id number]. Topic: Other Billing Question >> May 21, 2023  3:58 PM Payton Doughty wrote: Reason for CRM: pt states she has the OC, but got a bill for her labs. Pt would like a call back w/ the interpreter. Interpreter [phone]

## 2023-10-28 ENCOUNTER — Telehealth: Payer: Self-pay

## 2023-10-28 NOTE — Telephone Encounter (Signed)
 Pt has left several messages. Using [employer], [place], [place], [place]: [id number], I have called the pt back. She advised she is calling to schedule her mammogram. I have advised the pt she is supposed to call the number on the front of her pink card as instructed each time she has come to [facility] and as I advised her on 11/04/22 when she had this same request. Pt was encouraged to leave a voicemail, in Spanish, if the scheduling team is unable to answer and they will call her back with an interpreter on the line. Pt expressed understanding of this information.

## 2023-11-03 ENCOUNTER — Encounter: Payer: Self-pay | Admitting: Family Medicine

## 2023-11-03 ENCOUNTER — Other Ambulatory Visit: Payer: Self-pay

## 2023-11-03 ENCOUNTER — Ambulatory Visit: Payer: Self-pay | Attending: Family Medicine | Admitting: Family Medicine

## 2023-11-03 VITALS — BP 120/75 | HR 72 | Ht 60.0 in | Wt 166.2 lb

## 2023-11-03 DIAGNOSIS — Z13228 Encounter for screening for other metabolic disorders: Secondary | ICD-10-CM

## 2023-11-03 DIAGNOSIS — M546 Pain in thoracic spine: Secondary | ICD-10-CM

## 2023-11-03 DIAGNOSIS — M722 Plantar fascial fibromatosis: Secondary | ICD-10-CM

## 2023-11-03 DIAGNOSIS — N6459 Other signs and symptoms in breast: Secondary | ICD-10-CM

## 2023-11-03 DIAGNOSIS — J4520 Mild intermittent asthma, uncomplicated: Secondary | ICD-10-CM

## 2023-11-03 MED ORDER — ALBUTEROL SULFATE HFA 108 (90 BASE) MCG/ACT IN AERS
2.0000 | INHALATION_SPRAY | Freq: Four times a day (QID) | RESPIRATORY_TRACT | 0 refills | Status: AC | PRN
  Filled 2023-11-03: qty 6.7, 25d supply, fill #0

## 2023-11-03 MED ORDER — TIZANIDINE HCL 4 MG PO TABS
4.0000 mg | ORAL_TABLET | Freq: Three times a day (TID) | ORAL | 1 refills | Status: AC | PRN
  Filled 2023-11-03: qty 60, 20d supply, fill #0

## 2023-11-03 MED ORDER — ARNUITY ELLIPTA 100 MCG/ACT IN AEPB
1.0000 | INHALATION_SPRAY | Freq: Every day | RESPIRATORY_TRACT | 2 refills | Status: DC
  Filled 2023-11-03: qty 30, fill #0

## 2023-11-03 NOTE — Patient Instructions (Signed)
 Fascitis plantar: qu debe saber Plantar Fasciitis: What to Know  La fascia plantar es una banda de tejido grueso que se encuentra en la parte inferior del pie. Conecta el hueso del taln con la base de los dedos del pie. Si la fascia se irrita, puede causar dolor en el taln o en el pie. Esto se denomina fascitis plantar. En algunos casos, la fascitis plantar puede hacer que le resulte difcil caminar o moverse. El dolor suele ser peor a la maana despus de dormir, o despus de Personal assistant sentado o acostado durante un perodo de [place]. El dolor tambin puede ser peor despus de caminar o estar de pie por [employer]. Cules son las causas? Las causas de la fascitis plantar pueden ser las siguientes: Estar de pie durante [place]. Usar zapatos que no tengan un buen soporte para el arco. Realizar actividades de alto impacto. Estas son cosas que Teacher, music. Incluyen lo siguiente: Ballet. Ejercicios aerbicos. Estos son ejercicios que hacen que el corazn lata ms rpido. Tener sobrepeso. Tener una forma de caminar, o marcha, que no es normal. Tener rigidez muscular en la pantorrilla, que es la parte posterior de la parte inferior de la pierna. Arcos Google, o pies planos. Comenzar un nuevo deporte o [place]. Cules son los signos o sntomas? El sntoma principal de la fascitis plantar es dolor en el taln. El dolor puede empeorar despus de lo siguiente: Al dar sus primeros pasos despus de un tiempo de reposo. Esto incluye por la maana despus de despertarse, o despus de haber estado sentado o acostado durante un [place]. Estar de pie quieto durante [employer]. El dolor puede disminuir despus de 30 a 45 minutos de [place], como caminar apaciblemente. Cmo se diagnostica? La fascitis plantar se puede diagnosticar en funcin de sus antecedentes mdicos, sus sntomas y un examen. El mdico controlar lo siguiente: Una zona dolorida en la parte  inferior del pie. Arco alto en el pie o pies planos. Dolor al Doctor, general practice. Dificultad para mover el pie. Tambin pueden hacerle pruebas. Pueden incluir: Radiografas. Ecografa. Resonancia magntica (RM). Cmo se trata? El tratamiento depende de la gravedad de su fascitis plantar. Puede incluir lo siguiente: Terapia de RHCE. Esto significa reposo, hielo, presin (compresin) y Lexicographer (elevar) el pie afectado. Ejercicios para estirar las pantorrillas y la fascia plantar. Una frula nocturna. Esta mantiene el pie estirado y [place] mientras usted duerme. Fisioterapia. Esta puede ayudar con los sntomas. Tambin puede prevenir problemas en el futuro. Inyecciones de un medicamento con corticoesteroides llamado cortisona. Esto puede ayudar a Engineer, materials y Youth worker. Terapia extracorprea con ondas de choque. Este tratamiento utiliza choques elctricos para estimular la fascia plantar. Si otros tratamientos no ayudan, tal vez deba someterse a [place]. Siga estas instrucciones en su casa: Control del dolor, la rigidez y la hinchazn  Use hielo, una bolsa de hielo o una botella de agua congelada como se lo hayan indicado. Coloque una toalla entre la piel y el hielo. Frote la parte inferior del pie sobre el hielo o la botella congelada. Haga esto durante 20 minutos, de 2 a 3 veces al [employer]. Si la piel se le pone de color rojo, quite el hielo de inmediato para evitar daos en la piel. El [place] de dao es mayor si no puede sentir dolor, Airline pilot o fro. Use calzado que tenga suela con amortiguacin de aire o almohadillas de gel. Tambin podra probar plantillas blandas hechas para la fascitis  plantar. Actividad Trate de no hacer cosas que le causen [employer]. Pregunte qu cosas son seguras para que haga. Haga ejercicio segn las indicaciones. Pruebe actividades de bajo impacto. Esto significa que son ms suaves para las articulaciones. Incluyen lo siguiente: Nadar. Gimnasia  acutica. Andar en bicicleta. Instrucciones generales Tome sus medicamentos nicamente segn las indicaciones. Use una frula nocturna como le hayan indicado. Afloje la frula si siente hormigueo en los dedos de los pies, si estn entumecidos, o si se le enfran y se tornan de [employer]. Mantenga un peso saludable. De ser necesario, trabaje con su mdico para perder [employer]. Comunquese con un mdico si: Los sntomas no desaparecen con Scientist, research (medical). Su dolor empeora. El dolor hace que le sea difcil moverse o hacer las cosas cotidianas. Esta informacin no tiene Theme park manager el consejo del mdico. Asegrese de hacerle al mdico cualquier pregunta que tenga. Document Revised: 11/03/2022 Document Reviewed: 11/03/2022 Elsevier Patient Education  2024 [employer].

## 2023-11-03 NOTE — Progress Notes (Signed)
 Subjective:  Patient ID: Emily Andrade, female    DOB: Oct 01, 1980  Age: 43 y.o. MRN: [id number]  CC: Medical Management of Chronic Issues (Back pain/Left breast pain)     Discussed the use of AI scribe software for clinical note transcription with the patient, who gave verbal consent to proceed.  History of Present Illness Emily Andrade Sura Canul is a 43 year old female with a history of Asthma, Eczema who presents with left breast pain and back pain.  Left breast pain localized to the left nipple was present for about a week, described as similar to breastfeeding, and occurred with pressure. The pain has resolved, and there are no current lumps or pain.  Mammogram from 04/2023 was negative for malignancy. Menstrual cycles have been irregular for three years, occurring every four to six months, and more recently, a year apart. The last period was a month and a half ago, lasting two days.  Severe back pain persists, worsening with standing and alleviated by stretching.  When she coughs the pain is exacerbated.    She has asthma and is out of refills for her medication.  Bilateral heel pain is also present worse with prolonged standing and she is unsure of the duration.    Past Medical History:  Diagnosis Date   Anemia    Asthma    childhood   Depression    Frequent UTI    Hyperlipidemia     Past Surgical History:  Procedure Laterality Date   CESAREAN SECTION N/A 04/08/2013   Procedure: CESAREAN SECTION;  Surgeon: Glenys GORMAN Birk, MD;  Location: [facility];  Service: Obstetrics;  Laterality: N/A;   CESAREAN SECTION N/A 04/13/2018   Procedure: CESAREAN SECTION;  Surgeon: Barbra Lang PARAS, DO;  Location: [facility];  Service: Obstetrics;  Laterality: N/A;    Family History  Problem Relation Age of Onset   Hypertension Mother    Heart disease Father        MI   Diabetes Father    Diabetes Sister    Birth defects Other        Downs syndrome, cleft lip  and palate   Breast cancer Other     Social History   Socioeconomic History   Marital status: Single    Spouse name: Not on file   Number of children: 2   Years of education: Not on file   Highest education level: 6th grade  Occupational History   Not on file  Tobacco Use   Smoking status: Never   Smokeless tobacco: Never  Vaping Use   Vaping status: Never Used  Substance and Sexual Activity   Alcohol use: No    Alcohol/week: 0.0 standard drinks of alcohol   Drug use: No   Sexual activity: Yes    Birth control/protection: Condom  Other Topics Concern   Not on file  Social History Narrative   Not on file   Social Drivers of Health   Financial Resource Strain: Not on File (12/31/2021)   Received from [employer]    Financial Resource Strain: 0  Food Insecurity: Food Insecurity Present (03/26/2023)   Hunger Vital Sign    Worried About Running Out of Food in the Last Year: Sometimes true    Ran Out of Food in the Last Year: Sometimes true  Transportation Needs: No Transportation Needs (03/26/2023)   PRAPARE - Transportation    Lack of Transportation (Medical): No    Lack of  Transportation (Non-Medical): No  Physical Activity: Not on File (12/31/2021)   Received from [facility]   Physical Activity    Physical Activity: 0  Stress: Not on File (12/31/2021)   Received from [facility]   Stress    Stress: 0  Social Connections: Not on File (01/11/2023)   Received from [facility]   Social Connections    Connectedness: 0    Allergies  Allergen Reactions   Statins Other (See Comments)    Abdominal pain    Outpatient Medications Prior to Visit  Medication Sig Dispense Refill   cetirizine  (ZYRTEC ) 10 MG tablet Take 1 Tablet by mouth once daily (Patient not taking: Reported on 11/03/2023) 90 tablet 3   Cholecalciferol (VITAMIN D3) 10 MCG (400 UNIT) tablet Take 2 tablets (800 Units total) by mouth daily. (Patient not taking: Reported on 11/03/2023) 180 tablet 1    clobetasol  cream (TEMOVATE ) 0.05 % Apply 1 Application topically 2 (two) times daily. (Patient not taking: Reported on 11/03/2023) 30 g 0   diclofenac  Sodium (VOLTAREN ) 1 % GEL Apply 4 g topically 4 (four) times daily. (Patient not taking: Reported on 11/03/2023) 100 g 1   ergocalciferol  (DRISDOL ) 1.25 MG (50000 UT) capsule Take 1 capsule (50,000 Units total) by mouth once a week. (Patient not taking: Reported on 11/03/2023) 12 capsule 1   fluticasone  (FLONASE ) 50 MCG/ACT nasal spray Place 1 spray into both nostrils daily. (Patient not taking: Reported on 11/03/2023) 16 g 1   Multiple Vitamin (MULTIVITAMIN ADULT PO) Take by mouth. (Patient not taking: Reported on 11/03/2023)     sennosides-docusate sodium  (SENOKOT-S) 8.6-50 MG tablet Take 2 tablets by mouth daily. (Patient not taking: Reported on 11/03/2023) 100 tablet 2   albuterol  (PROAIR  HFA) 108 (90 Base) MCG/ACT inhaler Inhale 2 puffs into the lungs every 6 (six) hours as needed for wheezing or shortness of breath. (Patient not taking: Reported on 11/03/2023) 6.7 g 0   Fluticasone  Furoate (ARNUITY ELLIPTA ) 100 MCG/ACT AEPB Inhale 1 Inhalation into the lungs daily. (Patient not taking: Reported on 11/03/2023) 30 each 2   tiZANidine  (ZANAFLEX ) 4 MG tablet Take 1 tablet (4 mg total) by mouth every 8 (eight) hours as needed. (Patient not taking: Reported on 11/03/2023) 60 tablet 1   No facility-administered medications prior to visit.     ROS Review of Systems  Constitutional:  Negative for activity change and appetite change.  HENT:  Negative for sinus pressure and sore throat.   Respiratory:  Negative for chest tightness, shortness of breath and wheezing.   Cardiovascular:  Negative for chest pain and palpitations.  Gastrointestinal:  Negative for abdominal distention, abdominal pain and constipation.  Genitourinary: Negative.   Musculoskeletal:  Positive for back pain.  Psychiatric/Behavioral:  Negative for behavioral problems and dysphoric mood.      Objective:  BP 120/75   Pulse 72   Ht 5' (1.524 m)   Wt 166 lb 3.2 oz (75.4 kg)   SpO2 97%   BMI 32.46 kg/m      11/03/2023    3:36 PM 05/05/2023    8:41 AM 03/26/2023   10:10 AM  BP/Weight  Systolic BP 120 115 134  Diastolic BP 75 74 90  Wt. (Lbs) 166.2 162.8 160  BMI 32.46 kg/m2 31.79 kg/m2 30.99 kg/m2      Physical Exam Constitutional:      Appearance: She is well-developed.   Cardiovascular:     Rate and Rhythm: Normal rate.     Heart sounds: Normal heart sounds. No  murmur heard. Pulmonary:     Effort: Pulmonary effort is normal.     Breath sounds: Normal breath sounds. No wheezing or rales.  Chest:     Chest wall: No tenderness.  Abdominal:     General: Bowel sounds are normal. There is no distension.     Palpations: Abdomen is soft. There is no mass.     Tenderness: There is no abdominal tenderness.   Musculoskeletal:        General: Tenderness (midline thoracic spine) present. Normal range of motion.     Right lower leg: No edema.     Left lower leg: No edema.     Comments: Thoracic back pain is exacerbated by forward flexion of right shoulder. Tenderness to palpation of bilateral heel   Neurological:     Mental Status: She is alert and oriented to person, place, and time.   Psychiatric:        Mood and Affect: Mood normal.        Latest Ref Rng & Units 12/30/2022    3:28 PM 04/22/2021   10:31 AM 04/14/2018    9:18 AM  CMP  Glucose 70 - 99 mg/dL 99  93  878   BUN 6 - 24 mg/dL 12  10  6    Creatinine 0.57 - 1.00 mg/dL 9.28  9.33  9.29   Sodium 134 - 144 mmol/L 139  139  130   Potassium 3.5 - 5.2 mmol/L 4.3  4.5  3.6   Chloride 96 - 106 mmol/L 104  101  102   CO2 20 - 29 mmol/L 23  24  19    Calcium  8.7 - 10.2 mg/dL 9.2  9.5  7.9   Total Protein 6.0 - 8.5 g/dL 6.7  7.5    Total Bilirubin 0.0 - 1.2 mg/dL <9.7  0.3    Alkaline Phos 44 - 121 IU/L 66  75    AST 0 - 40 IU/L 20  23    ALT 0 - 32 IU/L 20  18      Lipid Panel     Component  Value Date/Time   CHOL 228 (H) 04/22/2021 1033   TRIG 131 04/22/2021 1033   HDL 53 04/22/2021 1033   CHOLHDL 4.3 04/22/2021 1033   LDLCALC 152 (H) 04/22/2021 1033    CBC    Component Value Date/Time   WBC 6.2 12/30/2022 1528   WBC 11.1 (H) 04/14/2018 0535   RBC 4.49 12/30/2022 1528   RBC 3.05 (L) 04/14/2018 0535   HGB 11.8 12/30/2022 1528   HCT 36.4 12/30/2022 1528   PLT 243 12/30/2022 1528   MCV 81 12/30/2022 1528   MCH 26.3 (L) 12/30/2022 1528   MCH 28.9 04/14/2018 0535   MCHC 32.4 12/30/2022 1528   MCHC 33.1 04/14/2018 0535   RDW 12.4 12/30/2022 1528   LYMPHSABS 2.7 12/30/2022 1528   EOSABS 0.2 12/30/2022 1528   BASOSABS 0.0 12/30/2022 1528    Lab Results  Component Value Date   HGBA1C 5.4 09/24/2017        Assessment & Plan Thoracic back pain Severe pain likely due to muscle spasm, exacerbated by standing and coughing, relieved by stretching. - Prescribe muscle relaxant -tizanidine  - Advise use of warm compresses for pain relief.  Plantar Fasciitis Chronic heel pain consistent with plantar fasciitis. Previous topical treatment ineffective. - Refer to podiatrist for further evaluation and possible corticosteroid injection. - Provide information in Spanish on exercises to alleviate foot pain. - Recommend use  of insoles in shoes for cushioning.  Asthma Asthma management requires attention due to lack of current medication refills. - Send prescription for Albuterol  (ProAir  HFA) to the pharmacy.  Breast symptoms Resolved Mammogram from 12/24 was negative for malignancy   Screening for metabolic disorders Will send off labs   Meds ordered this encounter  Medications   tiZANidine  (ZANAFLEX ) 4 MG tablet    Sig: Take 1 tablet (4 mg total) by mouth every 8 (eight) hours as needed.    Dispense:  60 tablet    Refill:  1   albuterol  (PROAIR  HFA) 108 (90 Base) MCG/ACT inhaler    Sig: Inhale 2 puffs into the lungs every 6 (six) hours as needed for wheezing or  shortness of breath.    Dispense:  6.7 g    Refill:  0   Fluticasone  Furoate (ARNUITY ELLIPTA ) 100 MCG/ACT AEPB    Sig: Inhale 1 Inhalation into the lungs daily.    Dispense:  30 each    Refill:  2    Follow-up: Return in about 6 months (around 05/05/2024) for Chronic medical conditions.       Corrina Sabin, MD, FAAFP. [facility] [place], [place] [phone]   11/03/2023, 4:52 PM

## 2023-11-24 ENCOUNTER — Ambulatory Visit: Payer: Self-pay | Admitting: Podiatry

## 2024-01-18 ENCOUNTER — Other Ambulatory Visit: Payer: Self-pay

## 2024-02-01 ENCOUNTER — Other Ambulatory Visit: Payer: Self-pay

## 2024-02-01 DIAGNOSIS — J4520 Mild intermittent asthma, uncomplicated: Secondary | ICD-10-CM

## 2024-02-01 MED ORDER — ARNUITY ELLIPTA 100 MCG/ACT IN AEPB: 1.0000 | INHALATION_SPRAY | Freq: Every day | RESPIRATORY_TRACT | 3 refills | Status: AC

## 2024-02-02 ENCOUNTER — Other Ambulatory Visit: Payer: Self-pay

## 2024-02-03 ENCOUNTER — Other Ambulatory Visit: Payer: Self-pay

## 2024-02-19 ENCOUNTER — Other Ambulatory Visit: Payer: Self-pay

## 2024-02-22 ENCOUNTER — Other Ambulatory Visit: Payer: Self-pay

## 2024-02-24 ENCOUNTER — Telehealth: Payer: Self-pay

## 2024-02-24 NOTE — Telephone Encounter (Signed)
 Telephoned patient using interpreter#[id number]. Patient requested mammogram scholarship application. Patient's address confirmed. [facility]

## 2024-05-09 ENCOUNTER — Other Ambulatory Visit: Payer: Self-pay

## 2024-05-10 ENCOUNTER — Other Ambulatory Visit: Payer: Self-pay

## 2024-05-10 ENCOUNTER — Encounter: Payer: Self-pay | Admitting: Family Medicine

## 2024-05-10 ENCOUNTER — Ambulatory Visit: Payer: Self-pay | Attending: Family Medicine | Admitting: Family Medicine

## 2024-05-10 VITALS — BP 110/74 | HR 79 | Temp 98.0°F | Ht 60.0 in | Wt 167.4 lb

## 2024-05-10 DIAGNOSIS — R7303 Prediabetes: Secondary | ICD-10-CM

## 2024-05-10 DIAGNOSIS — G5601 Carpal tunnel syndrome, right upper limb: Secondary | ICD-10-CM

## 2024-05-10 DIAGNOSIS — Z1211 Encounter for screening for malignant neoplasm of colon: Secondary | ICD-10-CM

## 2024-05-10 DIAGNOSIS — R202 Paresthesia of skin: Secondary | ICD-10-CM

## 2024-05-10 DIAGNOSIS — E782 Mixed hyperlipidemia: Secondary | ICD-10-CM

## 2024-05-10 DIAGNOSIS — J4520 Mild intermittent asthma, uncomplicated: Secondary | ICD-10-CM

## 2024-05-10 DIAGNOSIS — Z13228 Encounter for screening for other metabolic disorders: Secondary | ICD-10-CM

## 2024-05-10 DIAGNOSIS — Z23 Encounter for immunization: Secondary | ICD-10-CM

## 2024-05-10 LAB — POCT GLYCOSYLATED HEMOGLOBIN (HGB A1C): HbA1c, POC (prediabetic range): 6 % (ref 5.7–6.4)

## 2024-05-10 MED ORDER — PREDNISONE 20 MG PO TABS
20.0000 mg | ORAL_TABLET | Freq: Every day | ORAL | 0 refills | Status: AC
  Filled 2024-05-10: qty 5, 5d supply, fill #0

## 2024-05-10 NOTE — Patient Instructions (Signed)
 VISIT SUMMARY:  During today's visit, we discussed your persistent numbness in your left arm and leg, right elbow pain, plantar fasciitis, occasional dizziness, and vision difficulties. We also reviewed your asthma management and discussed your prediabetes and cholesterol levels.  YOUR PLAN:  -CARPAL TUNNEL SYNDROME OF THE RIGHT UPPER LIMB: Carpal tunnel syndrome is a condition where the median nerve is compressed at the wrist, causing pain and numbness in the hand and fingers. You have been referred to an orthopedic specialist for further evaluation and possible injections. Additionally, prednisone  has been prescribed to help manage inflammation and pain.  -PARESTHESIA OF THE LEFT ARM AND LEFT LEG: Paresthesia refers to abnormal sensations such as numbness or tingling. Your symptoms have persisted since July, and a nerve test was normal. We have ordered a vitamin B12 level test to rule out a deficiency as a possible cause.  -PREDIABETES: Prediabetes means your blood sugar levels are higher than normal but not high enough to be classified as diabetes. Your A1c level is 6.0, indicating a risk for diabetes. You are advised to reduce your intake of starchy foods, engage in regular exercise, and work towards weight loss.  -MIXED HYPERLIPIDEMIA: Mixed hyperlipidemia is a condition with elevated levels of cholesterol and triglycerides in the blood. We have scheduled a fasting cholesterol test for Thursday to get accurate results.  -ASTHMA: Asthma is a condition where your airways narrow and swell, producing extra mucus and making it difficult to breathe. You are currently managing it with inhalers, but you noted a low supply. Please ensure you have enough medication.  INSTRUCTIONS:  Please follow up with the orthopedic specialist as referred for your carpal tunnel syndrome. Get your fasting cholesterol test done on Thursday. We will contact you with the results of your vitamin B12 test. Continue to  manage your asthma with your inhalers and ensure you have enough supply. Make lifestyle changes as discussed to manage your prediabetes and cholesterol levels.

## 2024-05-10 NOTE — Progress Notes (Signed)
 "  Subjective:  Patient ID: Emily Andrade, female    DOB: 07/04/1980  Age: 44 y.o. MRN: [id number]  CC: Medical Management of Chronic Issues (Numbness  in left arm and leg /Pain in right arm )     Discussed the use of AI scribe software for clinical note transcription with the patient, who gave verbal consent to proceed.  History of Present Illness Emily Andrade is a 44 year old female with a history of asthma who presents with persistent numbness in her left arm and leg.  She has had persistent numbness in her left arm and leg for 4 to 5 months. She initially also had facial swelling and numbness that resolved with medication and home remedies, but the arm and leg numbness remained. Symptoms worsen with pressure or when she holds objects in her hand. She has plantar fasciitis in her left foot and uses shoe inserts with improvement in heel pain. She notes numbness in her left leg from the knee down, especially when wearing shoes and stockings at work.  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 Pain worsens when she grips and cuts, interferes with sleep, and is more pronounced on workdays. A topical ointment provides minimal relief.  Denies presence of problems with gait, denies presence of visual loss, she has no weakness in her extremities. She has occasional dizziness while walking and difficulty seeing letters on her computer monitor at work.  She is using her asthma inhaler but has run out of her allergy medication and nasal spray. She received inhalers 3 months ago and has one remaining.    Past Medical History:  Diagnosis Date   Anemia    Asthma    childhood   Depression    Frequent UTI    Hyperlipidemia     Past Surgical History:  Procedure Laterality Date   CESAREAN SECTION N/A 04/08/2013   Procedure: CESAREAN SECTION;  Surgeon: Glenys GORMAN Birk, MD;  Location: [facility];  Service: Obstetrics;   Laterality: N/A;   CESAREAN SECTION N/A 04/13/2018   Procedure: CESAREAN SECTION;  Surgeon: Barbra Lang PARAS, DO;  Location: [facility];  Service: Obstetrics;  Laterality: N/A;    Family History  Problem Relation Age of Onset   Hypertension Mother    Heart disease Father        MI   Diabetes Father    Diabetes Sister    Birth defects Other        Downs syndrome, cleft lip and palate   Breast cancer Other     Social History   Socioeconomic History   Marital status: Single    Spouse name: Not on file   Number of children: 2   Years of education: Not on file   Highest education level: 6th grade  Occupational History   Not on file  Tobacco Use   Smoking status: Never   Smokeless tobacco: Never  Vaping Use   Vaping status: Never Used  Substance and Sexual Activity   Alcohol use: No    Alcohol/week: 0.0 standard drinks of alcohol   Drug use: No   Sexual activity: Yes    Birth control/protection: Condom  Other Topics Concern   Not on file  Social History Narrative   Not on file   Social Drivers of Health   Tobacco Use: Low Risk (11/03/2023)   Patient History    Smoking Tobacco Use: Never  Smokeless Tobacco Use: Never    Passive Exposure: Not on file  Financial Resource Strain: Not on File (12/31/2021)   Received from [employer]    Financial Resource Strain: 0  Food Insecurity: Food Insecurity Present (03/26/2023)   Hunger Vital Sign    Worried About Running Out of Food in the Last Year: Sometimes true    Ran Out of Food in the Last Year: Sometimes true  Transportation Needs: No Transportation Needs (03/26/2023)   PRAPARE - Administrator, Civil Service (Medical): No    Lack of Transportation (Non-Medical): No  Physical Activity: Not on File (12/31/2021)   Received from [facility]   Physical Activity    Physical Activity: 0  Stress: Not on File (12/31/2021)   Received from [facility]   Stress    Stress: 0  Social Connections:  Not on File (01/11/2023)   Received from [facility]   Social Connections    Connectedness: 0  Depression (PHQ2-9): Medium Risk (05/10/2024)   Depression (PHQ2-9)    PHQ-2 Score: 7  Alcohol Screen: Not on file  Housing: Not on file  Utilities: Not on file  Health Literacy: Not on file    Allergies[1]  Outpatient Medications Prior to Visit  Medication Sig Dispense Refill   albuterol  (PROAIR  HFA) 108 (90 Base) MCG/ACT inhaler Inhale 2 puffs into the lungs every 6 (six) hours as needed for wheezing or shortness of breath. (Patient not taking: Reported on 05/10/2024) 6.7 g 0   cetirizine  (ZYRTEC ) 10 MG tablet Take 1 Tablet by mouth once daily (Patient not taking: Reported on 05/10/2024) 90 tablet 3   Cholecalciferol (VITAMIN D3) 10 MCG (400 UNIT) tablet Take 2 tablets (800 Units total) by mouth daily. (Patient not taking: Reported on 05/10/2024) 180 tablet 1   clobetasol  cream (TEMOVATE ) 0.05 % Apply 1 Application topically 2 (two) times daily. (Patient not taking: Reported on 05/10/2024) 30 g 0   diclofenac  Sodium (VOLTAREN ) 1 % GEL Apply 4 g topically 4 (four) times daily. (Patient not taking: Reported on 05/10/2024) 100 g 1   ergocalciferol  (DRISDOL ) 1.25 MG (50000 UT) capsule Take 1 capsule (50,000 Units total) by mouth once a week. (Patient not taking: Reported on 05/10/2024) 12 capsule 1   fluticasone  (FLONASE ) 50 MCG/ACT nasal spray Place 1 spray into both nostrils daily. (Patient not taking: Reported on 05/10/2024) 16 g 1   Fluticasone  Furoate (ARNUITY ELLIPTA ) 100 MCG/ACT AEPB Inhale 1 Inhalation into the lungs daily. (Patient not taking: Reported on 05/10/2024) 30 each 3   Multiple Vitamin (MULTIVITAMIN ADULT PO) Take by mouth. (Patient not taking: Reported on 05/10/2024)     sennosides-docusate sodium  (SENOKOT-S) 8.6-50 MG tablet Take 2 tablets by mouth daily. (Patient not taking: Reported on 05/10/2024) 100 tablet 2   tiZANidine  (ZANAFLEX ) 4 MG tablet Take 1 tablet (4 mg total) by mouth every 8 (eight) hours as  needed. (Patient not taking: Reported on 05/10/2024) 60 tablet 1   No facility-administered medications prior to visit.     ROS Review of Systems  Constitutional:  Negative for activity change and appetite change.  HENT:  Negative for sinus pressure and sore throat.   Respiratory:  Negative for chest tightness, shortness of breath and wheezing.   Cardiovascular:  Negative for chest pain and palpitations.  Gastrointestinal:  Negative for abdominal distention, abdominal pain and constipation.  Genitourinary: Negative.   Musculoskeletal:        See HPI  Neurological:  Positive for numbness.  Psychiatric/Behavioral:  Negative for behavioral problems and dysphoric mood.     Objective:  BP 110/74   Pulse 79   Temp 98 F (36.7 C) (Oral)   Ht 5' (1.524 m)   Wt 167 lb 6.4 oz (75.9 kg)   SpO2 97%   BMI 32.69 kg/m      05/10/2024   10:15 AM 11/03/2023    3:36 PM 05/05/2023    8:41 AM  BP/Weight  Systolic BP 110 120 115  Diastolic BP 74 75 74  Wt. (Lbs) 167.4 166.2 162.8  BMI 32.69 kg/m2 32.46 kg/m2 31.79 kg/m2      Physical Exam Constitutional:      Appearance: She is well-developed.  Cardiovascular:     Rate and Rhythm: Normal rate.     Heart sounds: Normal heart sounds. No murmur heard. Pulmonary:     Effort: Pulmonary effort is normal.     Breath sounds: Normal breath sounds. No wheezing or rales.  Chest:     Chest wall: No tenderness.  Abdominal:     General: Bowel sounds are normal. There is no distension.     Palpations: Abdomen is soft. There is no mass.     Tenderness: There is no abdominal tenderness.  Musculoskeletal:        General: Tenderness (on palpation of medial epicondyle) present. Normal range of motion.     Right lower leg: No edema.     Left lower leg: No edema.     Comments: Positive Tinel sign in right wrist, negative in the left Negative Phalen signs bilaterally  Neurological:     Mental Status: She is alert and oriented to person, place, and  time.  Psychiatric:        Mood and Affect: Mood normal.        Latest Ref Rng & Units 12/30/2022    3:28 PM 04/22/2021   10:31 AM 04/14/2018    9:18 AM  CMP  Glucose 70 - 99 mg/dL 99  93  878   BUN 6 - 24 mg/dL 12  10  6    Creatinine 0.57 - 1.00 mg/dL 9.28  9.33  9.29   Sodium 134 - 144 mmol/L 139  139  130   Potassium 3.5 - 5.2 mmol/L 4.3  4.5  3.6   Chloride 96 - 106 mmol/L 104  101  102   CO2 20 - 29 mmol/L 23  24  19    Calcium  8.7 - 10.2 mg/dL 9.2  9.5  7.9   Total Protein 6.0 - 8.5 g/dL 6.7  7.5    Total Bilirubin 0.0 - 1.2 mg/dL <9.7  0.3    Alkaline Phos 44 - 121 IU/L 66  75    AST 0 - 40 IU/L 20  23    ALT 0 - 32 IU/L 20  18      Lipid Panel     Component Value Date/Time   CHOL 228 (H) 04/22/2021 1033   TRIG 131 04/22/2021 1033   HDL 53 04/22/2021 1033   CHOLHDL 4.3 04/22/2021 1033   LDLCALC 152 (H) 04/22/2021 1033    CBC    Component Value Date/Time   WBC 6.2 12/30/2022 1528   WBC 11.1 (H) 04/14/2018 0535   RBC 4.49 12/30/2022 1528   RBC 3.05 (L) 04/14/2018 0535   HGB 11.8 12/30/2022 1528   HCT 36.4 12/30/2022 1528   PLT 243 12/30/2022 1528   MCV 81 12/30/2022 1528   MCH 26.3 (L) 12/30/2022 1528  MCH 28.9 04/14/2018 0535   MCHC 32.4 12/30/2022 1528   MCHC 33.1 04/14/2018 0535   RDW 12.4 12/30/2022 1528   LYMPHSABS 2.7 12/30/2022 1528   EOSABS 0.2 12/30/2022 1528   BASOSABS 0.0 12/30/2022 1528    Lab Results  Component Value Date   HGBA1C 6.0 05/10/2024       Assessment & Plan Carpal tunnel syndrome of the right upper limb Symptoms exacerbated by work activities, particularly cutting vegetables. Pain and numbness in fingers, worse on workdays. - Referred to orthopedic specialist for evaluation and possible injections. - Prescribed prednisone  for inflammation and pain management.  Paresthesia of the left arm and left leg Persistent numbness since July, with initial facial symptoms in August. Symptoms persist with pressure or prolonged  use. Normal nerve test patient.  Differential includes vitamin B12 deficiency. - Ordered vitamin B12 level to rule out deficiency. - Screen for diabetes mellitus today with A1c in the prediabetic range - She does not have symptoms suspicious of multiple sclerosis - If symptoms persist consider referral to neurology for nerve conduction studies and she may also need MRI of her spine   Prediabetes A1c of 6.0 indicates risk for diabetes. - Advised reducing starch intake. - Recommended regular exercise. - Encouraged weight loss.  Mixed hyperlipidemia Previous elevated cholesterol levels. Current test pending due to non-fasting state. - Scheduled fasting cholesterol test for Thursday.  Asthma Stable with no flares Managed with inhalers. Low supply noted.    Screening for colon cancer - FIT test ordered   Meds ordered this encounter  Medications   predniSONE  (DELTASONE ) 20 MG tablet    Sig: Take 1 tablet (20 mg total) by mouth daily with breakfast.    Dispense:  5 tablet    Refill:  0    Follow-up: Return in about 6 months (around 11/07/2024) for Chronic medical conditions.       Corrina Sabin, MD, FAAFP. [facility] [place], [place] [phone]   05/10/2024, 1:25 PM     [1]  Allergies Allergen Reactions   Statins Other (See Comments)    Abdominal pain   "

## 2024-05-12 ENCOUNTER — Ambulatory Visit: Payer: Self-pay | Attending: Family Medicine

## 2024-05-12 ENCOUNTER — Other Ambulatory Visit: Payer: Self-pay | Admitting: Obstetrics and Gynecology

## 2024-05-12 DIAGNOSIS — Z13228 Encounter for screening for other metabolic disorders: Secondary | ICD-10-CM

## 2024-05-12 DIAGNOSIS — E782 Mixed hyperlipidemia: Secondary | ICD-10-CM

## 2024-05-12 DIAGNOSIS — Z1231 Encounter for screening mammogram for malignant neoplasm of breast: Secondary | ICD-10-CM

## 2024-05-12 DIAGNOSIS — R202 Paresthesia of skin: Secondary | ICD-10-CM

## 2024-05-13 ENCOUNTER — Ambulatory Visit: Payer: Self-pay | Admitting: Family Medicine

## 2024-05-13 LAB — CMP14+EGFR
ALT: 26 IU/L (ref 0–32)
AST: 23 IU/L (ref 0–40)
Albumin: 4.6 g/dL (ref 3.9–4.9)
Alkaline Phosphatase: 66 IU/L (ref 41–116)
BUN/Creatinine Ratio: 14 (ref 9–23)
BUN: 9 mg/dL (ref 6–24)
Bilirubin Total: 0.3 mg/dL (ref 0.0–1.2)
CO2: 24 mmol/L (ref 20–29)
Calcium: 9.7 mg/dL (ref 8.7–10.2)
Chloride: 105 mmol/L (ref 96–106)
Creatinine, Ser: 0.63 mg/dL (ref 0.57–1.00)
Globulin, Total: 2.3 g/dL (ref 1.5–4.5)
Glucose: 95 mg/dL (ref 70–99)
Potassium: 4.7 mmol/L (ref 3.5–5.2)
Sodium: 142 mmol/L (ref 134–144)
Total Protein: 6.9 g/dL (ref 6.0–8.5)
eGFR: 111 mL/min/1.73

## 2024-05-13 LAB — LP+NON-HDL CHOLESTEROL
Cholesterol, Total: 237 mg/dL — ABNORMAL HIGH (ref 100–199)
HDL: 48 mg/dL
LDL Chol Calc (NIH): 160 mg/dL — ABNORMAL HIGH (ref 0–99)
Total Non-HDL-Chol (LDL+VLDL): 189 mg/dL — ABNORMAL HIGH (ref 0–129)
Triglycerides: 162 mg/dL — ABNORMAL HIGH (ref 0–149)
VLDL Cholesterol Cal: 29 mg/dL (ref 5–40)

## 2024-05-13 LAB — VITAMIN B12: Vitamin B-12: 1021 pg/mL (ref 232–1245)

## 2024-05-19 ENCOUNTER — Other Ambulatory Visit: Payer: Self-pay

## 2024-06-02 ENCOUNTER — Ambulatory Visit (INDEPENDENT_AMBULATORY_CARE_PROVIDER_SITE_OTHER): Payer: Self-pay | Admitting: Orthopedic Surgery

## 2024-06-02 ENCOUNTER — Other Ambulatory Visit: Payer: Self-pay

## 2024-06-02 DIAGNOSIS — M25521 Pain in right elbow: Secondary | ICD-10-CM

## 2024-06-28 ENCOUNTER — Encounter: Payer: Self-pay | Admitting: Physical Medicine and Rehabilitation

## 2024-06-30 ENCOUNTER — Ambulatory Visit: Payer: Self-pay

## 2024-11-08 ENCOUNTER — Ambulatory Visit: Payer: Self-pay | Admitting: Family Medicine
